# Patient Record
Sex: Female | Born: 1935 | Race: Black or African American | Hispanic: No | Smoking: Never smoker
Health system: Southern US, Community
[De-identification: ages and names within clinical notes are randomized; demographics above are authoritative.]

## PROBLEM LIST (undated history)

## (undated) DIAGNOSIS — I10 Essential (primary) hypertension: Secondary | ICD-10-CM

## (undated) DIAGNOSIS — E119 Type 2 diabetes mellitus without complications: Secondary | ICD-10-CM

## (undated) DIAGNOSIS — C3490 Malignant neoplasm of unspecified part of unspecified bronchus or lung: Secondary | ICD-10-CM

## (undated) HISTORY — PX: CHOLECYSTECTOMY: SHX55

---

## 2013-11-02 ENCOUNTER — Encounter (HOSPITAL_COMMUNITY): Payer: Self-pay | Admitting: Emergency Medicine

## 2013-11-02 ENCOUNTER — Emergency Department (HOSPITAL_COMMUNITY)
Admission: EM | Admit: 2013-11-02 | Discharge: 2013-11-02 | Disposition: A | Discharge status: Home / Self Care | Payer: Medicare Other | Attending: Emergency Medicine | Admitting: Emergency Medicine

## 2013-11-02 ENCOUNTER — Emergency Department (HOSPITAL_COMMUNITY): Payer: Medicare Other

## 2013-11-02 DIAGNOSIS — R0789 Other chest pain: Secondary | ICD-10-CM | POA: Diagnosis not present

## 2013-11-02 DIAGNOSIS — I10 Essential (primary) hypertension: Secondary | ICD-10-CM | POA: Diagnosis not present

## 2013-11-02 DIAGNOSIS — Z79899 Other long term (current) drug therapy: Secondary | ICD-10-CM | POA: Diagnosis not present

## 2013-11-02 DIAGNOSIS — E119 Type 2 diabetes mellitus without complications: Secondary | ICD-10-CM | POA: Insufficient documentation

## 2013-11-02 DIAGNOSIS — Z85118 Personal history of other malignant neoplasm of bronchus and lung: Secondary | ICD-10-CM | POA: Diagnosis not present

## 2013-11-02 DIAGNOSIS — Z7982 Long term (current) use of aspirin: Secondary | ICD-10-CM | POA: Insufficient documentation

## 2013-11-02 DIAGNOSIS — R112 Nausea with vomiting, unspecified: Secondary | ICD-10-CM

## 2013-11-02 DIAGNOSIS — R079 Chest pain, unspecified: Secondary | ICD-10-CM

## 2013-11-02 DIAGNOSIS — Z7951 Long term (current) use of inhaled steroids: Secondary | ICD-10-CM | POA: Diagnosis not present

## 2013-11-02 DIAGNOSIS — Z791 Long term (current) use of non-steroidal anti-inflammatories (NSAID): Secondary | ICD-10-CM | POA: Diagnosis not present

## 2013-11-02 DIAGNOSIS — R197 Diarrhea, unspecified: Secondary | ICD-10-CM | POA: Diagnosis not present

## 2013-11-02 HISTORY — DX: Malignant neoplasm of unspecified part of unspecified bronchus or lung: C34.90

## 2013-11-02 HISTORY — DX: Essential (primary) hypertension: I10

## 2013-11-02 HISTORY — DX: Type 2 diabetes mellitus without complications: E11.9

## 2013-11-02 LAB — CBC WITH DIFFERENTIAL/PLATELET
Basophils Absolute: 0 10*3/uL (ref 0.0–0.1)
Basophils Relative: 0 % (ref 0–1)
EOS ABS: 0 10*3/uL (ref 0.0–0.7)
Eosinophils Relative: 0 % (ref 0–5)
HEMATOCRIT: 35.4 % — AB (ref 36.0–46.0)
HEMOGLOBIN: 11.6 g/dL — AB (ref 12.0–15.0)
Lymphocytes Relative: 7 % — ABNORMAL LOW (ref 12–46)
Lymphs Abs: 0.8 10*3/uL (ref 0.7–4.0)
MCH: 27.2 pg (ref 26.0–34.0)
MCHC: 32.8 g/dL (ref 30.0–36.0)
MCV: 82.9 fL (ref 78.0–100.0)
MONO ABS: 0.5 10*3/uL (ref 0.1–1.0)
MONOS PCT: 4 % (ref 3–12)
NEUTROS PCT: 89 % — AB (ref 43–77)
Neutro Abs: 10.4 10*3/uL — ABNORMAL HIGH (ref 1.7–7.7)
Platelets: 217 10*3/uL (ref 150–400)
RBC: 4.27 MIL/uL (ref 3.87–5.11)
RDW: 15.3 % (ref 11.5–15.5)
WBC: 11.8 10*3/uL — ABNORMAL HIGH (ref 4.0–10.5)

## 2013-11-02 LAB — COMPREHENSIVE METABOLIC PANEL
ALBUMIN: 3.6 g/dL (ref 3.5–5.2)
ALT: 19 U/L (ref 0–35)
ANION GAP: 15 (ref 5–15)
AST: 22 U/L (ref 0–37)
Alkaline Phosphatase: 51 U/L (ref 39–117)
BUN: 22 mg/dL (ref 6–23)
CALCIUM: 9.8 mg/dL (ref 8.4–10.5)
CO2: 22 mEq/L (ref 19–32)
Chloride: 106 mEq/L (ref 96–112)
Creatinine, Ser: 0.83 mg/dL (ref 0.50–1.10)
GFR calc non Af Amer: 62 mL/min — ABNORMAL LOW (ref >90)
GFR, EST AFRICAN AMERICAN: 71 mL/min — AB (ref >90)
GLUCOSE: 127 mg/dL — AB (ref 70–99)
Potassium: 4.1 mEq/L (ref 3.7–5.3)
Sodium: 143 mEq/L (ref 137–147)
TOTAL PROTEIN: 7.2 g/dL (ref 6.0–8.3)
Total Bilirubin: 0.5 mg/dL (ref 0.3–1.2)

## 2013-11-02 LAB — LIPASE, BLOOD: Lipase: 52 U/L (ref 11–59)

## 2013-11-02 LAB — TROPONIN I: Troponin I: <0.3 ng/mL (ref <0.30)

## 2013-11-02 MED ORDER — ONDANSETRON HCL 4 MG PO TABS: 4.0000 mg | ORAL_TABLET | Freq: Four times a day (QID) | ORAL | Status: AC

## 2013-11-02 NOTE — Discharge Instructions (Signed)

## 2013-11-02 NOTE — ED Notes (Signed)
Patient with emesis and chest pain that woke her from sleep this am.  Patient does not have any chest pain at this time.  Patient has had 2 episodes similar to this before.  Patient is CAOx3 upon arrival to ED.  Patient took 324mg  ASA before EMS arrival.  No shortness of breath.

## 2013-11-02 NOTE — ED Notes (Signed)
Pt returned from xray, wheelchaired to bathroom

## 2013-11-02 NOTE — ED Notes (Signed)
Pt resting; family at bedside; no needs at this time

## 2013-11-02 NOTE — ED Provider Notes (Signed)
MSE was initiated and I personally evaluated the patient and placed orders (if any) at  6:29 AM on November 02, 2013.  The patient appears stable so that the remainder of the MSE may be completed by another provider.  Pt woke just prior to arrival with complaint of "feeling sick" with nausea, vomiting, brief chest pain, and diarrhea.  No sick contacts, no unusual foods,  No fever.  No shortness of breath.  Pt denies any current chest pain or abdominal pain.  Pt with h/o DM, HTN, denies prior cardiac history.  Pt s/p cholecystectomy.  Pt has taken aspirin-324 mg.    Labs, ekg and chest xray ordered.  IV from EMS patent.  Pt is stable.      EKG Interpretation  Date/Time:  Saturday November 02 2013 06:23:07 EDT Ventricular Rate:  54 PR Interval:  311 QRS Duration: 95 QT Interval:  475 QTC Calculation: 450 R Axis:   36 Text Interpretation:  Sinus or ectopic atrial rhythm Prolonged PR interval Low voltage, precordial leads Borderline T abnormalities, inferior leads No old tracing to compare Confirmed by Malique Driskill  MD, Scarlette Hogston ([id number]) on 11/02/2013 6:31:32 AM          Kalman Drape, MD 11/02/13 [phone]

## 2013-11-02 NOTE — ED Provider Notes (Signed)
CSN: [id number]     Arrival date & time 11/02/13  0610 History   First MD Initiated Contact with Patient 11/02/13 [phone]     Chief Complaint  Patient presents with  . Chest Pain  . Emesis     (Consider location/radiation/quality/duration/timing/severity/associated sxs/prior Treatment) HPI Tammie Rios is an 80 year old female with past medical history of hypertension, diabetes, lung cancer who presents to the ER today with episode of nausea, vomiting, diarrhea, chest pain. Patient states she woke up nauseated approximately 3:30 AM this morning, and went to the bathroom and began vomiting and having diarrhea. Patient states was using the toilet, she noticed an acute onset of a tightness in her substernal region. Patient states the discomfort lasted for approximately 5-6 minutes, and has not returned since. Patient states she also noted mild lightheadedness, and some shortness of breath at that time. He states the discomfort radiated to her back. Patient denies loss of consciousness, palpitations, abdominal pain, dysuria, fever. Past Medical History  Diagnosis Date  . Diabetes mellitus without complication   . Hypertension   . Lung cancer    Past Surgical History  Procedure Laterality Date  . Cholecystectomy     No family history on file. History  Substance Use Topics  . Smoking status: Never Smoker   . Smokeless tobacco: Not on file  . Alcohol Use: No   OB History   Grav Para Term Preterm Abortions TAB SAB Ect Mult Living                 Review of Systems  Constitutional: Negative for fever.  HENT: Negative for trouble swallowing.   Eyes: Negative for visual disturbance.  Respiratory: Positive for chest tightness. Negative for shortness of breath.   Cardiovascular: Positive for chest pain.  Gastrointestinal: Positive for nausea, vomiting and diarrhea. Negative for abdominal pain.  Genitourinary: Negative for dysuria.  Musculoskeletal: Negative for neck pain.  Skin: Negative  for rash.  Neurological: Negative for dizziness, weakness and numbness.  Psychiatric/Behavioral: Negative.       Allergies  Lisinopril  Home Medications   Prior to Admission medications   Medication Sig Start Date End Date Taking? Authorizing Provider  albuterol (PROVENTIL HFA;VENTOLIN HFA) 108 (90 BASE) MCG/ACT inhaler Inhale 2 puffs into the lungs every 6 (six) hours as needed for wheezing or shortness of breath.   Yes Historical Provider, MD  aspirin 81 MG chewable tablet Chew 324 mg by mouth once.   Yes Historical Provider, MD  aspirin 81 MG chewable tablet Chew 81 mg by mouth daily.   Yes Historical Provider, MD  Cholecalciferol (VITAMIN D) 2000 UNITS tablet Take 2,000 Units by mouth daily.   Yes Historical Provider, MD  ezetimibe-simvastatin (VYTORIN) 10-40 MG per tablet Take 1 tablet by mouth daily.   Yes Historical Provider, MD  latanoprost (XALATAN) 0.005 % ophthalmic solution Place 1 drop into both eyes at bedtime.   Yes Historical Provider, MD  meloxicam (MOBIC) 15 MG tablet Take 15 mg by mouth daily.   Yes Historical Provider, MD  metFORMIN (GLUCOPHAGE) 500 MG tablet Take 500 mg by mouth 2 (two) times daily with a meal.   Yes Historical Provider, MD  mometasone-formoterol (DULERA) 100-5 MCG/ACT AERO Inhale 2 puffs into the lungs 2 (two) times daily.   Yes Historical Provider, MD  omega-3 acid ethyl esters (LOVAZA) 1 G capsule Take 1 g by mouth daily.   Yes Historical Provider, MD  sitaGLIPtin (JANUVIA) 50 MG tablet Take 50 mg by mouth daily.  Yes Historical Provider, MD  valsartan-hydrochlorothiazide (DIOVAN-HCT) 160-12.5 MG per tablet Take 1 tablet by mouth daily.   Yes Historical Provider, MD  vitamin C (ASCORBIC ACID) 500 MG tablet Take 500 mg by mouth daily.   Yes Historical Provider, MD  ondansetron (ZOFRAN) 4 MG tablet Take 1 tablet (4 mg total) by mouth every 6 (six) hours. 11/02/13   Carrie Mew, PA-C   BP 153/57  Pulse 59  Temp(Src) 97.3 F (36.3 C) (Oral)   Resp 24  Ht 5\' 3"  (1.6 m)  Wt 183 lb (83.008 kg)  BMI 32.43 kg/m2  SpO2 96% Physical Exam  Constitutional: She is oriented to person, place, and time. She appears well-developed and well-nourished. No distress.  HENT:  Head: Normocephalic and atraumatic.  Mouth/Throat: Oropharynx is clear and moist. No oropharyngeal exudate.  Eyes: Right eye exhibits no discharge. Left eye exhibits no discharge. No scleral icterus.  Neck: Normal range of motion.  Cardiovascular: Normal rate, regular rhythm and normal heart sounds.   No murmur heard. Pulmonary/Chest: Effort normal. No accessory muscle usage. Not tachypneic. No respiratory distress. She has rales in the right lower field and the left lower field.  Mild bibasilar inspiratory rales noted.  Abdominal: Soft. There is no tenderness.  Musculoskeletal: Normal range of motion. She exhibits no edema and no tenderness.  Neurological: She is alert and oriented to person, place, and time. No cranial nerve deficit. Coordination normal.  Skin: Skin is warm and dry. No rash noted. She is not diaphoretic.  Psychiatric: She has a normal mood and affect.    ED Course  Procedures (including critical care time) Labs Review Labs Reviewed  CBC WITH DIFFERENTIAL - Abnormal; Notable for the following:    WBC 11.8 (*)    Hemoglobin 11.6 (*)    HCT 35.4 (*)    Neutrophils Relative % 89 (*)    Neutro Abs 10.4 (*)    Lymphocytes Relative 7 (*)    All other components within normal limits  COMPREHENSIVE METABOLIC PANEL - Abnormal; Notable for the following:    Glucose, Bld 127 (*)    GFR calc non Af Amer 62 (*)    GFR calc Af Amer 71 (*)    All other components within normal limits  LIPASE, BLOOD  TROPONIN I  TROPONIN I    Imaging Review Dg Chest 2 View  11/02/2013   CLINICAL DATA:  Chest pain and emesis waking patient from sleep  EXAM: CHEST  2 VIEW  COMPARISON:  March 17, 2008  FINDINGS: There is no edema or consolidation. Heart is upper normal  in size with pulmonary vascularity within normal limits. No adenopathy. No pneumothorax. There is degenerative change in the thoracic spine. There is atherosclerotic change in the aorta.  IMPRESSION: No edema or consolidation.  Atherosclerotic change noted.   Electronically Signed   By: Lowella Grip M.D.   On: 11/02/2013 08:07     EKG Interpretation   Date/Time:  Saturday November 02 2013 06:23:07 EDT Ventricular Rate:  54 PR Interval:  311 QRS Duration: 95 QT Interval:  475 QTC Calculation: 450 R Axis:   36 Text Interpretation:  Sinus or ectopic atrial rhythm Prolonged PR interval  Low voltage, precordial leads Borderline T abnormalities, inferior leads  No old tracing to compare Confirmed by OTTER  MD, OLGA ([id number]) on  11/02/2013 6:31:32 AM      MDM   Final diagnoses:  Nausea and vomiting, vomiting of unspecified type  Chest pain, unspecified chest  pain type    80 year old female nonsmoker diabetic with hypertension with family history of a sister with cardiac history presenting with episode of nausea, vomiting, diarrhea with a 5-6 minute episode of chest tightness. Patient stating her symptoms persisted until she called EMS. In the ER patient is asymptomatic. Workup for rule out of ACS.  Patient with no remarkable leukocytosis, anemia, electrolyte imbalance. Chest radiograph unremarkable for any edema or consolidation.  12:00 PM: Patient's second troponin negative, patient remaining asymptomatic, we will discharge patient at this time and have her followup with primary care physician. I discussed return precautions with patient, and encourage her to return to ER should her symptoms change, persist, return, worsen or should she have any questions or concerns.Patient is to be discharged with recommendation to follow up with PCP in regards to today's hospital visit. Chest pain is not likely of cardiac or pulmonary etiology d/t presentation, Wells PE criteria negative, VSS, no  tracheal deviation, no JVD or new murmur, RRR, breath sounds equal bilaterally, EKG without acute abnormalities, negative troponin, and negative CXR. Pt has been advised  return to the ED is CP becomes exertional, associated with diaphoresis or nausea, radiates to left jaw/arm, worsens or becomes concerning in any way. Pt appears reliable for follow up and is agreeable to discharge.   BP 153/57  Pulse 59  Temp(Src) 97.3 F (36.3 C) (Oral)  Resp 24  Ht 5\' 3"  (1.6 m)  Wt 183 lb (83.008 kg)  BMI 32.43 kg/m2  SpO2 96%  Signed,  Dahlia Bailiff, PA-C 6:00 PM  This patient seen and discussed with Dr. Davonna Belling, M.D.  Carrie Mew, PA-C 11/02/13 1800

## 2013-11-02 NOTE — ED Notes (Signed)
Pt resting; talking to visitor in room; no needs at this time

## 2013-11-03 NOTE — ED Provider Notes (Signed)
Medical screening examination/treatment/procedure(s) were conducted as a shared visit with non-physician practitioner(s) and myself.  I personally evaluated the patient during the encounter.   EKG Interpretation   Date/Time:  Saturday November 02 2013 06:23:07 EDT Ventricular Rate:  54 PR Interval:  311 QRS Duration: 95 QT Interval:  475 QTC Calculation: 450 R Axis:   36 Text Interpretation:  Sinus or ectopic atrial rhythm Prolonged PR interval  Low voltage, precordial leads Borderline T abnormalities, inferior leads  No old tracing to compare Confirmed by OTTER  MD, OLGA ([id number]) on  11/02/2013 6:31:32 AM     Patient with chest pain. EKG reassuring. Has had history of same. Began with nausea vomiting diarrhea. Patient does have some risk factors, however I doubt this was ischemic in cause. Enzymes are negative. Will discharge home followup as needed  Jasper Riling. Alvino Chapel, MD 11/03/13 0730

## 2015-09-25 IMAGING — CR DG CHEST 2V
2 series · 2 of 2 positions shown · non-contrast
Comparison: March 17, 2008

CLINICAL DATA: Chest pain and emesis waking patient from sleep

EXAM:
CHEST  2 VIEW

[w chest pa]
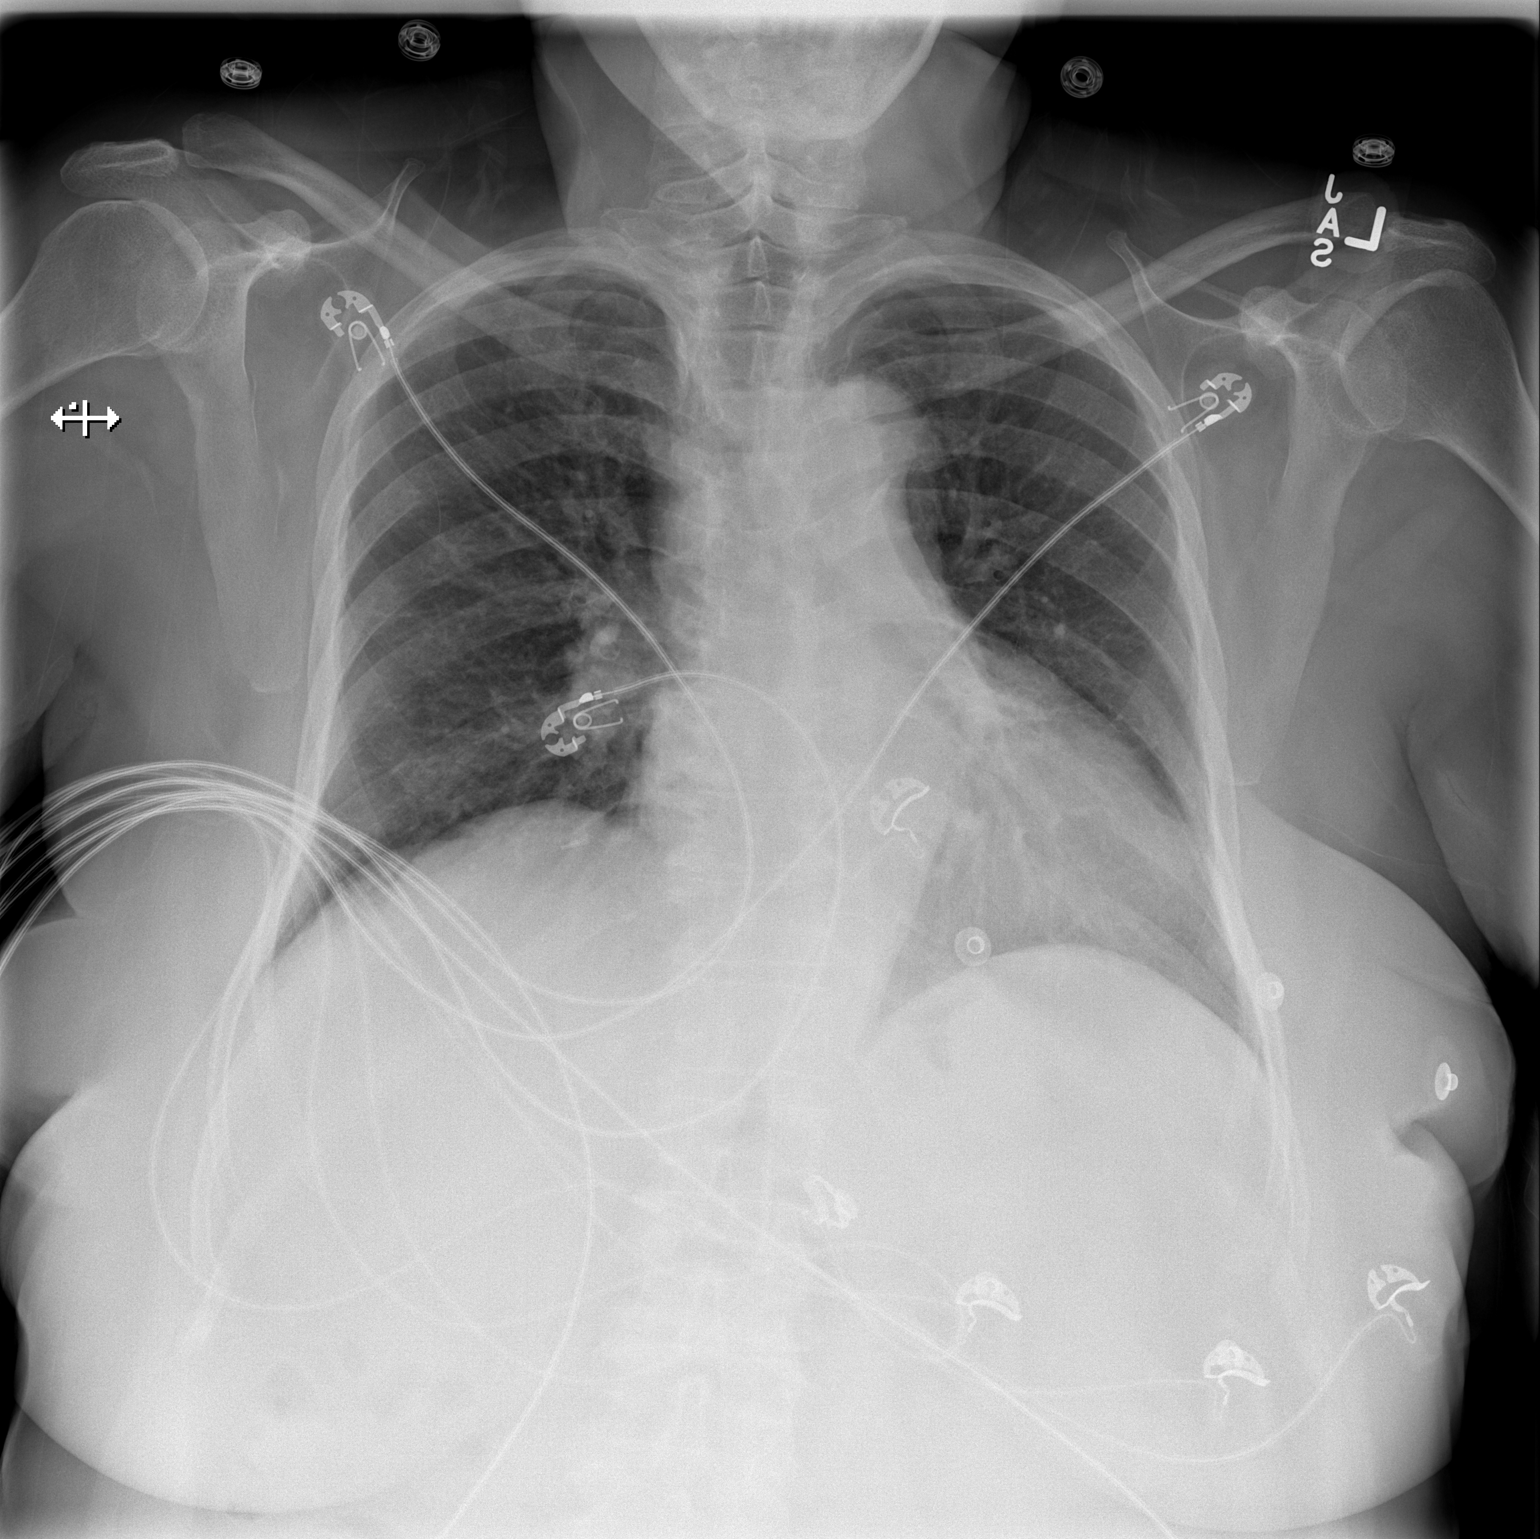

[w chest lat]
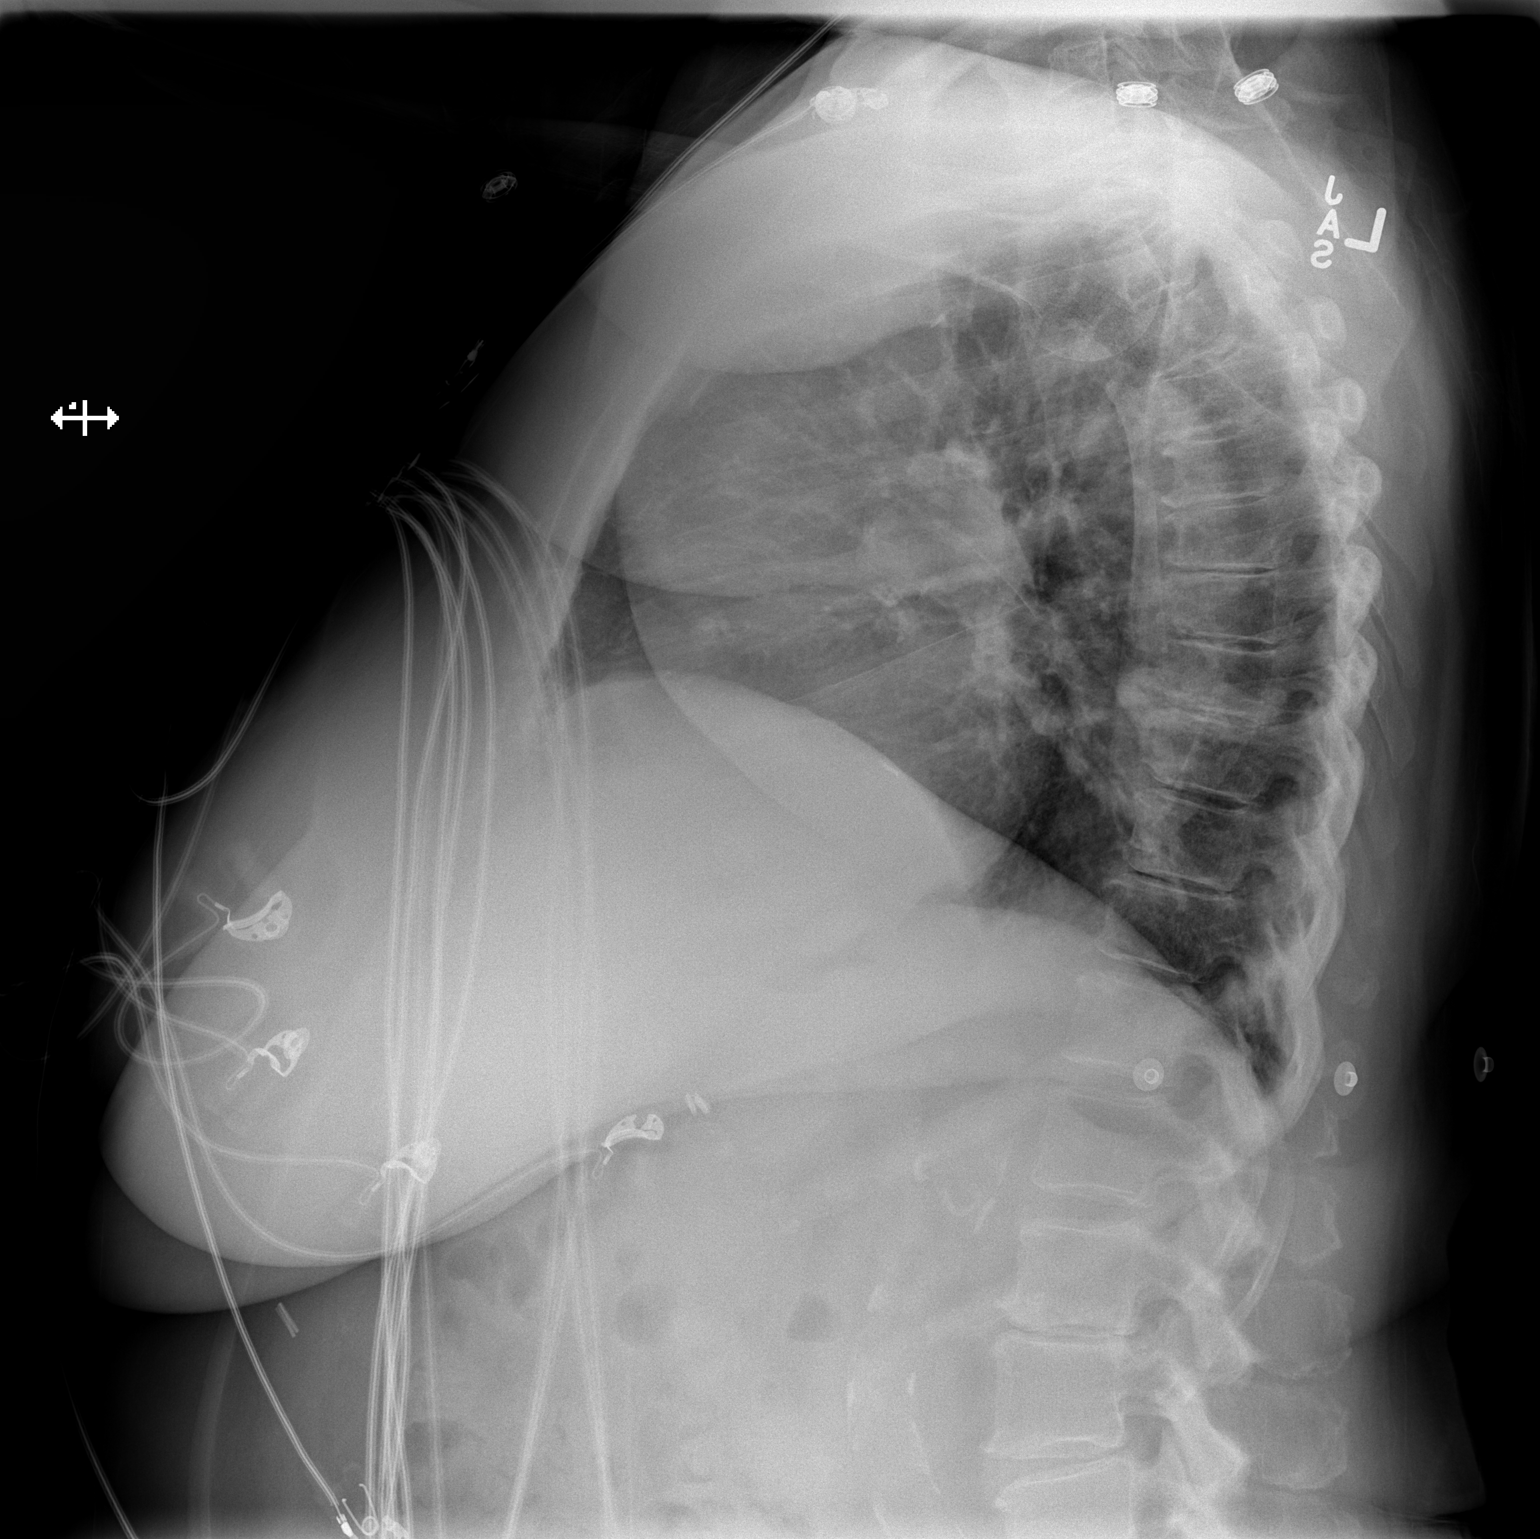

[2 of 2 positions shown; findings below may reference images not displayed]

FINDINGS: There is no edema or consolidation. Heart is upper normal in size
with pulmonary vascularity within normal limits. No adenopathy. No
pneumothorax. There is degenerative change in the thoracic spine.
There is atherosclerotic change in the aorta.
IMPRESSION: No edema or consolidation.  Atherosclerotic change noted.

## 2018-04-11 ENCOUNTER — Encounter (HOSPITAL_COMMUNITY): Payer: Self-pay | Admitting: Emergency Medicine

## 2018-04-11 ENCOUNTER — Emergency Department (HOSPITAL_COMMUNITY): Payer: Medicare Other

## 2018-04-11 ENCOUNTER — Other Ambulatory Visit: Payer: Self-pay

## 2018-04-11 DIAGNOSIS — Z539 Procedure and treatment not carried out, unspecified reason: Secondary | ICD-10-CM | POA: Diagnosis present

## 2018-04-11 DIAGNOSIS — Z923 Personal history of irradiation: Secondary | ICD-10-CM

## 2018-04-11 DIAGNOSIS — D509 Iron deficiency anemia, unspecified: Secondary | ICD-10-CM | POA: Diagnosis present

## 2018-04-11 DIAGNOSIS — N179 Acute kidney failure, unspecified: Secondary | ICD-10-CM | POA: Diagnosis present

## 2018-04-11 DIAGNOSIS — I25110 Atherosclerotic heart disease of native coronary artery with unstable angina pectoris: Secondary | ICD-10-CM | POA: Diagnosis present

## 2018-04-11 DIAGNOSIS — Z85118 Personal history of other malignant neoplasm of bronchus and lung: Secondary | ICD-10-CM

## 2018-04-11 DIAGNOSIS — Z79899 Other long term (current) drug therapy: Secondary | ICD-10-CM

## 2018-04-11 DIAGNOSIS — R079 Chest pain, unspecified: Secondary | ICD-10-CM | POA: Diagnosis present

## 2018-04-11 DIAGNOSIS — Z7984 Long term (current) use of oral hypoglycemic drugs: Secondary | ICD-10-CM

## 2018-04-11 DIAGNOSIS — R7989 Other specified abnormal findings of blood chemistry: Secondary | ICD-10-CM | POA: Diagnosis present

## 2018-04-11 DIAGNOSIS — Z66 Do not resuscitate: Secondary | ICD-10-CM | POA: Diagnosis present

## 2018-04-11 DIAGNOSIS — E871 Hypo-osmolality and hyponatremia: Secondary | ICD-10-CM | POA: Diagnosis present

## 2018-04-11 DIAGNOSIS — Z87891 Personal history of nicotine dependence: Secondary | ICD-10-CM

## 2018-04-11 DIAGNOSIS — I214 Non-ST elevation (NSTEMI) myocardial infarction: Secondary | ICD-10-CM | POA: Diagnosis not present

## 2018-04-11 DIAGNOSIS — Z9049 Acquired absence of other specified parts of digestive tract: Secondary | ICD-10-CM

## 2018-04-11 DIAGNOSIS — F419 Anxiety disorder, unspecified: Secondary | ICD-10-CM | POA: Diagnosis present

## 2018-04-11 DIAGNOSIS — Z7189 Other specified counseling: Secondary | ICD-10-CM

## 2018-04-11 DIAGNOSIS — R778 Other specified abnormalities of plasma proteins: Secondary | ICD-10-CM | POA: Diagnosis present

## 2018-04-11 DIAGNOSIS — I10 Essential (primary) hypertension: Secondary | ICD-10-CM | POA: Diagnosis present

## 2018-04-11 DIAGNOSIS — Z7982 Long term (current) use of aspirin: Secondary | ICD-10-CM

## 2018-04-11 DIAGNOSIS — E119 Type 2 diabetes mellitus without complications: Secondary | ICD-10-CM

## 2018-04-11 DIAGNOSIS — J45909 Unspecified asthma, uncomplicated: Secondary | ICD-10-CM | POA: Diagnosis present

## 2018-04-11 DIAGNOSIS — J9601 Acute respiratory failure with hypoxia: Secondary | ICD-10-CM

## 2018-04-11 DIAGNOSIS — R001 Bradycardia, unspecified: Secondary | ICD-10-CM | POA: Diagnosis present

## 2018-04-11 DIAGNOSIS — I440 Atrioventricular block, first degree: Secondary | ICD-10-CM | POA: Diagnosis present

## 2018-04-11 DIAGNOSIS — E872 Acidosis: Secondary | ICD-10-CM | POA: Diagnosis present

## 2018-04-11 DIAGNOSIS — I5031 Acute diastolic (congestive) heart failure: Secondary | ICD-10-CM | POA: Diagnosis not present

## 2018-04-11 DIAGNOSIS — Z515 Encounter for palliative care: Secondary | ICD-10-CM

## 2018-04-11 DIAGNOSIS — I110 Hypertensive heart disease with heart failure: Secondary | ICD-10-CM | POA: Diagnosis present

## 2018-04-11 DIAGNOSIS — I959 Hypotension, unspecified: Secondary | ICD-10-CM | POA: Diagnosis present

## 2018-04-11 LAB — CBC WITH DIFFERENTIAL/PLATELET
ABS IMMATURE GRANULOCYTES: 0.04 10*3/uL (ref 0.00–0.07)
BASOS ABS: 0 10*3/uL (ref 0.0–0.1)
BASOS PCT: 0 %
Eosinophils Absolute: 0 10*3/uL (ref 0.0–0.5)
Eosinophils Relative: 0 %
HCT: 36.1 % (ref 36.0–46.0)
Hemoglobin: 11.4 g/dL — ABNORMAL LOW (ref 12.0–15.0)
IMMATURE GRANULOCYTES: 1 %
Lymphocytes Relative: 14 %
Lymphs Abs: 0.8 10*3/uL (ref 0.7–4.0)
MCH: 26.8 pg (ref 26.0–34.0)
MCHC: 31.6 g/dL (ref 30.0–36.0)
MCV: 84.7 fL (ref 80.0–100.0)
Monocytes Absolute: 0.4 10*3/uL (ref 0.1–1.0)
Monocytes Relative: 8 %
Neutro Abs: 4 10*3/uL (ref 1.7–7.7)
Neutrophils Relative %: 77 %
PLATELETS: 178 10*3/uL (ref 150–400)
RBC: 4.26 MIL/uL (ref 3.87–5.11)
RDW: 14.3 % (ref 11.5–15.5)
WBC: 5.2 10*3/uL (ref 4.0–10.5)
nRBC: 0 % (ref 0.0–0.2)

## 2018-04-11 LAB — BASIC METABOLIC PANEL
ANION GAP: 11 (ref 5–15)
BUN: 19 mg/dL (ref 8–23)
CO2: 17 mmol/L — ABNORMAL LOW (ref 22–32)
Calcium: 9.2 mg/dL (ref 8.9–10.3)
Chloride: 105 mmol/L (ref 98–111)
Creatinine, Ser: 1.19 mg/dL — ABNORMAL HIGH (ref 0.44–1.00)
GFR calc Af Amer: 46 mL/min — ABNORMAL LOW (ref >60)
GFR, EST NON AFRICAN AMERICAN: 40 mL/min — AB (ref >60)
Glucose, Bld: 143 mg/dL — ABNORMAL HIGH (ref 70–99)
POTASSIUM: 4.8 mmol/L (ref 3.5–5.1)
SODIUM: 133 mmol/L — AB (ref 135–145)

## 2018-04-11 LAB — TROPONIN I
Troponin I: 0.17 ng/mL (ref <0.03)
Troponin I: 0.34 ng/mL (ref <0.03)

## 2018-04-11 LAB — I-STAT TROPONIN, ED: TROPONIN I, POC: 0.2 ng/mL — AB (ref 0.00–0.08)

## 2018-04-11 LAB — GLUCOSE, CAPILLARY: Glucose-Capillary: 170 mg/dL — ABNORMAL HIGH (ref 70–99)

## 2018-04-11 MED ORDER — LATANOPROST 0.005 % OP SOLN
1.0000 [drp] | Freq: Every day | OPHTHALMIC | Status: DC
  Administered 2018-04-11 – 2018-04-15 (×5): 1 [drp] via OPHTHALMIC
  Filled 2018-04-11 (×2): qty 2.5

## 2018-04-11 MED ORDER — ONDANSETRON HCL 4 MG/2ML IJ SOLN: 4.0000 mg | Freq: Four times a day (QID) | INTRAMUSCULAR | Status: DC | PRN

## 2018-04-11 MED ORDER — MOMETASONE FURO-FORMOTEROL FUM 100-5 MCG/ACT IN AERO
2.0000 | INHALATION_SPRAY | Freq: Two times a day (BID) | RESPIRATORY_TRACT | Status: DC
  Administered 2018-04-11 – 2018-04-15 (×7): 2 via RESPIRATORY_TRACT
  Filled 2018-04-11 (×2): qty 8.8

## 2018-04-11 MED ORDER — VITAMIN D 25 MCG (1000 UNIT) PO TABS
2000.0000 [IU] | ORAL_TABLET | Freq: Every day | ORAL | Status: DC
  Administered 2018-04-12 – 2018-04-14 (×3): 2000 [IU] via ORAL
  Filled 2018-04-11 (×4): qty 2

## 2018-04-11 MED ORDER — VALSARTAN 160 MG PO TABS
160.0000 mg | ORAL_TABLET | Freq: Every day | ORAL | Status: DC
  Administered 2018-04-11 – 2018-04-14 (×4): 160 mg via ORAL
  Filled 2018-04-11 (×6): qty 1

## 2018-04-11 MED ORDER — ACETAMINOPHEN 325 MG PO TABS: 650.0000 mg | ORAL_TABLET | ORAL | Status: DC | PRN

## 2018-04-11 MED ORDER — HEPARIN BOLUS VIA INFUSION
4000.0000 [IU] | Freq: Once | INTRAVENOUS | Status: AC
  Administered 2018-04-11: 4000 [IU] via INTRAVENOUS
  Filled 2018-04-11: qty 4000

## 2018-04-11 MED ORDER — HEPARIN (PORCINE) 25000 UT/250ML-% IV SOLN
800.0000 [IU]/h | INTRAVENOUS | Status: DC
  Administered 2018-04-11: 800 [IU]/h via INTRAVENOUS
  Filled 2018-04-11: qty 250

## 2018-04-11 MED ORDER — VALSARTAN-HYDROCHLOROTHIAZIDE 160-12.5 MG PO TABS: 1.0000 | ORAL_TABLET | Freq: Every day | ORAL | Status: DC

## 2018-04-11 MED ORDER — EZETIMIBE-SIMVASTATIN 10-40 MG PO TABS
1.0000 | ORAL_TABLET | Freq: Every day | ORAL | Status: DC
  Administered 2018-04-11: 1 via ORAL
  Filled 2018-04-11 (×3): qty 1

## 2018-04-11 MED ORDER — VITAMIN C 500 MG PO TABS
500.0000 mg | ORAL_TABLET | Freq: Every day | ORAL | Status: DC
  Administered 2018-04-12 – 2018-04-14 (×3): 500 mg via ORAL
  Filled 2018-04-11 (×4): qty 1

## 2018-04-11 MED ORDER — INSULIN ASPART 100 UNIT/ML ~~LOC~~ SOLN
0.0000 [IU] | Freq: Three times a day (TID) | SUBCUTANEOUS | Status: DC
  Administered 2018-04-12 – 2018-04-14 (×5): 2 [IU] via SUBCUTANEOUS
  Administered 2018-04-15: 3 [IU] via SUBCUTANEOUS
  Administered 2018-04-15 – 2018-04-16 (×2): 2 [IU] via SUBCUTANEOUS

## 2018-04-11 MED ORDER — HYDROCHLOROTHIAZIDE 12.5 MG PO CAPS
12.5000 mg | ORAL_CAPSULE | Freq: Every day | ORAL | Status: DC
  Administered 2018-04-11 – 2018-04-12 (×2): 12.5 mg via ORAL
  Filled 2018-04-11 (×2): qty 1

## 2018-04-11 MED ORDER — ALBUTEROL SULFATE (2.5 MG/3ML) 0.083% IN NEBU: 2.5000 mg | INHALATION_SOLUTION | Freq: Four times a day (QID) | RESPIRATORY_TRACT | Status: DC | PRN

## 2018-04-11 MED ORDER — PANTOPRAZOLE SODIUM 40 MG PO TBEC
40.0000 mg | DELAYED_RELEASE_TABLET | Freq: Every day | ORAL | Status: DC
  Administered 2018-04-12 – 2018-04-16 (×5): 40 mg via ORAL
  Filled 2018-04-11 (×5): qty 1

## 2018-04-11 MED ORDER — OMEGA-3-ACID ETHYL ESTERS 1 G PO CAPS
1.0000 g | ORAL_CAPSULE | Freq: Every day | ORAL | Status: DC
  Administered 2018-04-12 – 2018-04-13 (×2): 1 g via ORAL
  Filled 2018-04-11 (×4): qty 1

## 2018-04-11 MED ORDER — ASPIRIN 81 MG PO CHEW
81.0000 mg | CHEWABLE_TABLET | Freq: Every day | ORAL | Status: DC
  Administered 2018-04-12: 81 mg via ORAL
  Filled 2018-04-11: qty 1

## 2018-04-11 NOTE — ED Notes (Signed)
Verified heparin with [facility] RN

## 2018-04-11 NOTE — Progress Notes (Signed)
ANTICOAGULATION CONSULT NOTE - Initial Consult  Pharmacy Consult for heparin Indication: chest pain/ACS  Allergies  Allergen Reactions  . Lisinopril Cough    Patient Measurements: Height: 5\' 3"  (160 cm) Weight: 162 lb (73.5 kg) IBW/kg (Calculated) : 52.4 Heparin Dosing Weight: 68kg  Vital Signs: Temp: 98.5 F (36.9 C) (03/18 1323) Temp Source: Oral (03/18 1323) BP: 136/66 (03/18 1430) Pulse Rate: 84 (03/18 1430)  Labs: Recent Labs    03/30/2018 1418  CREATININE 1.19*    Estimated Creatinine Clearance: 29.6 mL/min (A) (by C-G formula based on SCr of 1.19 mg/dL (H)).   Medical History: Past Medical History:  Diagnosis Date  . Diabetes mellitus without complication ([place])   . Hypertension   . Lung cancer ([place])    Assessment: 10 YOF presenting with CP, initial troponin elevated, no anticoagulation PTA.  Hgb 11.4, plts wnl.   Goal of Therapy:  Heparin level 0.3-0.7 units/ml Monitor platelets by anticoagulation protocol: Yes   Plan:  Heparin 4000 units IV x 1, and gtt at 800 units/hr F/u 8 hour heparin level  Bertis Ruddy, PharmD Clinical Pharmacist Please check AMION for all [place] numbers 04/21/2018 3:06 PM

## 2018-04-11 NOTE — ED Notes (Signed)
ED TO INPATIENT HANDOFF REPORT  ED Nurse Name and Phone #: Gerron Guidotti [phone]  S Name/Age/Gender Tammie Rios 85 y.o. female Room/Bed: 046C/046C  Code Status   Code Status: Not on file  Home/SNF/Other Home Patient oriented to: self, place, time and situation Is this baseline? Yes   Triage Complete: Triage complete  Chief Complaint cp  Triage Note [facility]- pt from home. Pt had a cough for 2-3 weeks. Today she woke up with chest pain. Pt given 324 ASA and 2 nitro. No change in chest pain. Pt takes BP meds for high BP but has not had an appetite recently so hasnt been taking them.   BP 107/56 CBG 260 98% RA HR 70   Allergies Allergies  Allergen Reactions  . Lisinopril Cough    Level of Care/Admitting Diagnosis ED Disposition    ED Disposition Condition [facility]: [place] [[id number]]  Level of Care: Telemetry Cardiac [103]  Diagnosis: Chest pain [098119]  Admitting Physician: Oda Kilts [[id number]]  Attending Physician: Oda Kilts [[id number]]  PT Class (Do Not Modify): Observation [104]  PT Acc Code (Do Not Modify): Observation [10022]       B Medical/Surgery History Past Medical History:  Diagnosis Date  . Diabetes mellitus without complication ([place])   . Hypertension   . Lung cancer [facility])    Past Surgical History:  Procedure Laterality Date  . CHOLECYSTECTOMY       A IV Location/Drains/Wounds Patient Lines/Drains/Airways Status   Active Line/Drains/Airways    Name:   Placement date:   Placement time:   Site:   Days:   Peripheral IV 03/28/2018 Left Wrist   04/14/2018    1438    Wrist   less than 1          Intake/Output Last 24 hours No intake or output data in the 24 hours ending 04/09/2018 1552  Labs/Imaging Results for orders placed or performed during the hospital encounter of 04/15/2018 (from the past 48 hour(s))  Basic metabolic panel     Status: Abnormal   Collection Time: 04/03/2018  2:18 PM   Result Value Ref Range   Sodium 133 (L) 135 - 145 mmol/L   Potassium 4.8 3.5 - 5.1 mmol/L    Comment: SLIGHT HEMOLYSIS   Chloride 105 98 - 111 mmol/L   CO2 17 (L) 22 - 32 mmol/L   Glucose, Bld 143 (H) 70 - 99 mg/dL   BUN 19 8 - 23 mg/dL   Creatinine, Ser 1.19 (H) 0.44 - 1.00 mg/dL   Calcium 9.2 8.9 - 10.3 mg/dL   GFR calc non Af Amer 40 (L) >60 mL/min   GFR calc Af Amer 46 (L) >60 mL/min   Anion gap 11 5 - 15    Comment: Performed at [facility], [address] [place], [place] [address]  I-Stat Troponin, ED (not at [facility])     Status: Abnormal   Collection Time: 03/31/2018  2:23 PM  Result Value Ref Range   Troponin i, poc 0.20 (HH) 0.00 - 0.08 ng/mL   Comment NOTIFIED PHYSICIAN    Comment 3            Comment: Due to the release kinetics of cTnI, a negative result within the first hours of the onset of symptoms does not rule out myocardial infarction with certainty. If myocardial infarction is still suspected, repeat the test at appropriate intervals.   CBC with Differential  Status: Abnormal   Collection Time: 04/14/2018  2:52 PM  Result Value Ref Range   WBC 5.2 4.0 - 10.5 K/uL   RBC 4.26 3.87 - 5.11 MIL/uL   Hemoglobin 11.4 (L) 12.0 - 15.0 g/dL   HCT 36.1 36.0 - 46.0 %   MCV 84.7 80.0 - 100.0 fL   MCH 26.8 26.0 - 34.0 pg   MCHC 31.6 30.0 - 36.0 g/dL   RDW 14.3 11.5 - 15.5 %   Platelets 178 150 - 400 K/uL   nRBC 0.0 0.0 - 0.2 %   Neutrophils Relative % 77 %   Neutro Abs 4.0 1.7 - 7.7 K/uL   Lymphocytes Relative 14 %   Lymphs Abs 0.8 0.7 - 4.0 K/uL   Monocytes Relative 8 %   Monocytes Absolute 0.4 0.1 - 1.0 K/uL   Eosinophils Relative 0 %   Eosinophils Absolute 0.0 0.0 - 0.5 K/uL   Basophils Relative 0 %   Basophils Absolute 0.0 0.0 - 0.1 K/uL   Immature Granulocytes 1 %   Abs Immature Granulocytes 0.04 0.00 - 0.07 K/uL    Comment: Performed at [facility] [address], [place], [place] [address]   Dg Chest Portable 1 View  Result Date:  04/22/2018 CLINICAL DATA:  85 year old female with nonproductive cough for 6 days. Central chest pain. EXAM: PORTABLE CHEST 1 VIEW COMPARISON:  Chest radiographs 05/03/2017 and earlier. FINDINGS: Portable AP semi upright view at 1357 hours. Lung volumes and mediastinal contours remain normal. Visualized tracheal air column is within normal limits. Allowing for portable technique the lungs are clear. Paucity of bowel gas in the upper abdomen. No acute osseous abnormality identified. Stable cholecystectomy clips.  Calcified aortic atherosclerosis. IMPRESSION: No acute cardiopulmonary abnormality. Electronically Signed   By: Genevie Ann M.D.   On: 04/06/2018 14:18    Pending Labs Unresulted Labs (From admission, onward)    Start     Ordered   03/25/2018 0500  Heparin level (unfractionated)  Daily,   R     03/26/2018 1520   04/07/2018 0500  CBC  Daily,   R     04/21/2018 1520   03/30/2018 2300  Heparin level (unfractionated)  Once-Timed,   R     04/22/2018 1520   04/21/2018 1457  Troponin I - Once  Once,   STAT     04/14/2018 1456   04/21/2018 1359  CBC with Differential  ONCE - STAT,   STAT     03/30/2018 1358   Signed and Held  Troponin I - Now Then Q6H  Now then every 6 hours,   TIMED     Signed and Held          Vitals/Pain Today's Vitals   04/15/2018 1330 04/07/2018 1346 04/22/2018 1430 04/14/2018 1445  BP: 122/62 138/67 136/66 (!) 150/57  Pulse: 65 (!) 111 84 (!) 53  Resp: 17 12 18  (!) 21  Temp:      TempSrc:      SpO2: 100% 99% 99% 100%  Weight:      Height:      PainSc:        Isolation Precautions No active isolations  Medications Medications  heparin bolus via infusion 4,000 Units (has no administration in time range)  heparin ADULT infusion 100 units/mL (25000 units/214mL sodium chloride 0.45%) (has no administration in time range)    Mobility walks Low fall risk   Focused Assessments Cardiac Assessment Handoff:    Lab Results  Component  Value Date   TROPONINI <0.30 11/02/2013   No  results found for: DDIMER Does the Patient currently have chest pain? No     R Recommendations: See Admitting Provider Note  Report given to:   Additional Notes:

## 2018-04-11 NOTE — ED Triage Notes (Signed)
[facility]- pt from home. Pt had a cough for 2-3 weeks. Today she woke up with chest pain. Pt given 324 ASA and 2 nitro. No change in chest pain. Pt takes BP meds for high BP but has not had an appetite recently so hasnt been taking them.   BP 107/56 CBG 260 98% RA HR 70

## 2018-04-11 NOTE — H&P (Signed)
Date: 04/09/2018               Patient Name:  Tammie Rios MRN: [id number]  DOB: 12-19-1924 Age / Sex: 85 y.o., female   PCP: Rodena Medin, MD (Inactive)         Medical Service: Internal Medicine Teaching Service         Attending Physician: Dr. Rebeca Alert Raynaldo Opitz, MD    First Contact: Dr. Sherry Ruffing Pager: [phone]  Second Contact: Dr. Philipp Ovens Pager: [phone]       After Hours (After 5p/  First Contact Pager: [phone]  weekends / holidays): Second Contact Pager: [phone]   Chief Complaint: Chest pain  History of Present Illness: This is a 85 year old female with a history of lung cancer s/p treatment, HTN, DM who presented after an episode of chest pain that started this morning. Her daughter was present and provided some of the history. She was trying to eat her breakfast however was feeling nauseous and was unable to eat anything when she started having the pain, it is substernal, sharp in nature, no radiation, she is unsure of how long it lasted but states that she contacted the ambulance and that when she got the ASA and nitro that the pain subsided. She denies any sweating, fevers, chills, vomiting, or numbness or arm tingling. For the past few days she has been having some nausea, headaches, decreased appetites, dry cough, and decreased energy. She has been able to do her normal activities with no issues. She denied any recent sick contacts or travel history.   She did have an episode of chest pain in 2015, associated with nausea, sweating and diarrhea. She had normal enzymes at that time. She reports that she has had a stress test at [facility] that was normal, she has never had an echocardiogram.   In the ED she was noted to be afebrile, normal HR and RR, she did become hypertensive. Labs were significant of Na 133, Bicarb 17. Troponin elevated to 0.2. CXR showed no acute cardiopulmonary disease. EKG showed NSR, normal axis, no ST changes, . She was started on heparin  drip and cardiology was consulted. Patient was admitted to internal medicine for chest pain.   Meds:  Current Meds  Medication Sig  . albuterol (PROVENTIL HFA;VENTOLIN HFA) 108 (90 BASE) MCG/ACT inhaler Inhale 2 puffs into the lungs every 6 (six) hours as needed for wheezing or shortness of breath.  Marland Kitchen aspirin 81 MG chewable tablet Chew 324 mg by mouth once.  Marland Kitchen aspirin 81 MG chewable tablet Chew 81 mg by mouth daily.  . Cholecalciferol (VITAMIN D) 2000 UNITS tablet Take 2,000 Units by mouth daily.  Marland Kitchen ezetimibe-simvastatin (VYTORIN) 10-40 MG per tablet Take 1 tablet by mouth daily.  Marland Kitchen latanoprost (XALATAN) 0.005 % ophthalmic solution Place 1 drop into both eyes at bedtime.  . metFORMIN (GLUCOPHAGE) 500 MG tablet Take 500 mg by mouth 2 (two) times daily with a meal.  . mometasone-formoterol (DULERA) 100-5 MCG/ACT AERO Inhale 2 puffs into the lungs 2 (two) times daily.  . Omega-3 Fatty Acids (FISH OIL) 1000 MG CAPS Take 1,000 mg by mouth daily.  Marland Kitchen omeprazole (PRILOSEC) 20 MG capsule Take 20 mg by mouth daily before breakfast.  . simvastatin (ZOCOR) 40 MG tablet Take 40 mg by mouth at bedtime.  . valsartan-hydrochlorothiazide (DIOVAN-HCT) 160-12.5 MG per tablet Take 1 tablet by mouth daily.  . vitamin C (ASCORBIC ACID) 500 MG tablet Take 500 mg  by mouth daily.     Allergies: Allergies as of 04/17/2018 - Review Complete 04/23/2018  Allergen Reaction Noted  . Lisinopril Cough 11/02/2013   Past Medical History:  Diagnosis Date  . Diabetes mellitus without complication ([place])   . Hypertension   . Lung cancer ([place])     Family History: No significant family history.   Social History: Quit smoking 40 years ago, denies any EtOH or drug use. Lives at home with daughter.   Review of Systems: A complete ROS was negative except as per HPI.   Physical Exam: Blood pressure (!) 150/57, pulse (!) 53, temperature 98.5 F (36.9 C), temperature source Oral, resp. rate (!) 21, height 5\' 3"  (1.6 m),  weight 73.5 kg, SpO2 100 %. Physical Exam  Constitutional: She is oriented to person, place, and time and well-developed, well-nourished, and in no distress.  HENT:  Head: Normocephalic and atraumatic.  Eyes: Pupils are equal, round, and reactive to light. Conjunctivae and EOM are normal.  Neck: Normal range of motion. Neck supple. No thyromegaly present.  Cardiovascular: Normal rate, regular rhythm and normal heart sounds.  Pulmonary/Chest: Effort normal and breath sounds normal. No respiratory distress.  Abdominal: Soft. Bowel sounds are normal. She exhibits no distension.  Musculoskeletal: Normal range of motion.        General: No edema.  Neurological: She is alert and oriented to person, place, and time.  Skin: Skin is warm and dry. No erythema.  Psychiatric: Mood and affect normal.    EKG: personally reviewed my interpretation is normal sinus rhythm, normal sinus, prolonged PR interval, inverted T waves in leads I, aVL and V2, no ST elevation or depression  Assessment & Plan by Problem: Active Problems:   Chest pain  Chest pain: -85 year old female with history of hypertension, diabetes, and lung cancer who presented with typical chest pain that was sharp in nature, substernal,  relieved with nitroglycerin. She has been feeling unwell for a few days, including nausea, headaches, decreased appetite, dry cough and decreased energy.  He has some high risk features and heart score of 6.  On our evaluation she was chest pain-free.  On exam she was well-appearing, no acute distress, cardiac and pulmonary exam are unremarkable.  Labs are significant for a normal CBC, hypo-natremia of 196, metabolic acidosis, and AKI.  Initial troponin was elevated to 0.2, EKG showed first-degree heart block, normal sinus with new t wave inversions. She was started on a heparin drip and cardiology was consulted.  -Continue heparin drip -Trend troponins x3 -Repeat EKG in AM -Cardiology consulted, appreciate  recommendations - BMP - CBC - ASA 81 mg daily  Hypertension: -She is on Vytorin (exetimibe-simvastatin), and diovan (valasrtan-HCTZ) at home. On admission blood pressures were normal, however continued to increased.  -Restart home medications  Diabetes mellitus: She is on metformin at home. Hold this for now.  -SSI-I -Frequent CBGS   Asthma: -No wheezing on exam, denied any worsening shortness of breath. On albuterol and dulera at home.  -Continue dulera  FEN: No fluids, replete lytes prn, NPO, pending cardiology evaluation VTE ppx: Heparin drip Code Status: FULL    Dispo: Admit patient to Observation with expected length of stay less than 2 midnights.  Signed: Asencion Noble, MD 04/05/2018, 3:28 PM  Pager: [phone]

## 2018-04-11 NOTE — ED Provider Notes (Signed)
  Face-to-face evaluation   History: She presents for evaluation of nausea followed by sharp chest pain, earlier this morning.  Pain persisted until she was given aspirin and nitroglycerin, by EMS.  She feels like the second nitro took the pain and the nausea away.  No other similar problem.  No prior cardiac disease.  Physical exam: Alert elderly female who is calm and comfortable.  Heart regular rate and rhythm without murmur.  Lungs clear to auscultation.  Abdomen soft with mild epigastric tenderness.  Lower legs nontender without edema.  MDM-chest pain with some unchanged EKG from prior, likely of LVH.  Initial troponin elevated, heart pathway score increased at 6.  Patient will require further treatment and evaluation by cardiology.  Start heparin, nitro, PRN.  Medical screening examination/treatment/procedure(s) were conducted as a shared visit with non-physician practitioner(s) and myself.  I personally evaluated the patient during the encounter    Daleen Bo, MD 04/05/2018 [phone]

## 2018-04-11 NOTE — ED Provider Notes (Signed)
[facility] EMERGENCY DEPARTMENT Provider Note   CSN: [id number] Arrival date & time: 04/17/2018  1312    History   Chief Complaint No chief complaint on file.   HPI Tammie Rios is a 85 y.o. female   Patient reports that she developed chest pain at 12:00 PM today.  Describes it as a central pressure that was moderate intensity constant and associated with nausea.  EMS was called and she received 324 mg aspirin as well as 2 nitroglycerin with resolution of her pain.  Upon initial evaluation patient is chest pain-free.    Patient lives at home with her daughter who is at bedside who is provide supplemental history.  Patient reports intermittently productive cough, denies fever or shortness of breath that has been continuous since onset.  Of note patient without high risk travel behaviors or contact with high-risk individuals for COVID-19.    HPI  Past Medical History:  Diagnosis Date  . Diabetes mellitus without complication ([place])   . Hypertension   . Lung cancer [facility])     Patient Active Problem List   Diagnosis Date Noted  . Chest pain 04/01/2018    Past Surgical History:  Procedure Laterality Date  . CHOLECYSTECTOMY       OB History   No obstetric history on file.      Home Medications    Prior to Admission medications   Medication Sig Start Date End Date Taking? Authorizing Provider  albuterol (PROVENTIL HFA;VENTOLIN HFA) 108 (90 BASE) MCG/ACT inhaler Inhale 2 puffs into the lungs every 6 (six) hours as needed for wheezing or shortness of breath.   Yes [provider]  aspirin 81 MG chewable tablet Chew 324 mg by mouth once.   Yes [provider]  aspirin 81 MG chewable tablet Chew 81 mg by mouth daily.   Yes [provider]  Cholecalciferol (VITAMIN D) 2000 UNITS tablet Take 2,000 Units by mouth daily.   Yes [provider]  ezetimibe-simvastatin (VYTORIN) 10-40 MG per tablet Take 1 tablet by mouth daily.    Yes [provider]  latanoprost (XALATAN) 0.005 % ophthalmic solution Place 1 drop into both eyes at bedtime.   Yes [provider]  metFORMIN (GLUCOPHAGE) 500 MG tablet Take 500 mg by mouth 2 (two) times daily with a meal.   Yes [provider]  mometasone-formoterol (DULERA) 100-5 MCG/ACT AERO Inhale 2 puffs into the lungs 2 (two) times daily.   Yes [provider]  Omega-3 Fatty Acids (FISH OIL) 1000 MG CAPS Take 1,000 mg by mouth daily.   Yes [provider]  omeprazole (PRILOSEC) 20 MG capsule Take 20 mg by mouth daily before breakfast.   Yes [provider]  simvastatin (ZOCOR) 40 MG tablet Take 40 mg by mouth at bedtime. 03/22/18  Yes [provider]  valsartan-hydrochlorothiazide (DIOVAN-HCT) 160-12.5 MG per tablet Take 1 tablet by mouth daily.   Yes [provider]  vitamin C (ASCORBIC ACID) 500 MG tablet Take 500 mg by mouth daily.   Yes [provider]  ondansetron (ZOFRAN) 4 MG tablet Take 1 tablet (4 mg total) by mouth every 6 (six) hours. Patient not taking: Reported on 04/06/2018 11/02/13   Dahlia Bailiff, PA-C    Family History No family history on file.  Social History Social History   Tobacco Use  . Smoking status: Never Smoker  Substance Use Topics  . Alcohol use: No  . Drug use: No  Allergies   Lisinopril   Review of Systems Review of Systems  Constitutional: Negative.  Negative for chills and fever.  Respiratory: Positive for cough. Negative for shortness of breath.   Cardiovascular: Positive for chest pain. Negative for palpitations and leg swelling.  Gastrointestinal: Positive for nausea. Negative for abdominal pain, diarrhea and vomiting.  Musculoskeletal: Negative.  Negative for arthralgias and myalgias.  Neurological: Negative.  Negative for syncope, weakness and headaches.  All other systems reviewed and are negative.  Physical Exam Updated Vital Signs BP (!) 150/57    Pulse (!) 53   Temp 98.5 F (36.9 C) (Oral)   Resp (!) 21   Ht 5\' 3"  (1.6 m)   Wt 73.5 kg   SpO2 100%   BMI 28.70 kg/m   Physical Exam Constitutional:      General: She is not in acute distress.    Appearance: Normal appearance. She is well-developed. She is not ill-appearing or diaphoretic.  HENT:     Head: Normocephalic and atraumatic.     Right Ear: External ear normal.     Left Ear: External ear normal.     Nose: Nose normal.     Mouth/Throat:     Mouth: Mucous membranes are moist.     Pharynx: Oropharynx is clear.  Eyes:     General: Vision grossly intact. Gaze aligned appropriately.     Pupils: Pupils are equal, round, and reactive to light.  Neck:     Musculoskeletal: Normal range of motion.     Trachea: Trachea and phonation normal. No tracheal deviation.  Cardiovascular:     Rate and Rhythm: Normal rate and regular rhythm.     Pulses: Normal pulses.          Radial pulses are 2+ on the right side and 2+ on the left side.       Dorsalis pedis pulses are 2+ on the right side and 2+ on the left side.       Posterior tibial pulses are 2+ on the right side and 2+ on the left side.     Heart sounds: Normal heart sounds.  Pulmonary:     Effort: Pulmonary effort is normal. No respiratory distress.     Breath sounds: Normal breath sounds and air entry. No rhonchi.  Chest:     Chest wall: No tenderness.  Abdominal:     General: There is no distension.     Palpations: Abdomen is soft.     Tenderness: There is no abdominal tenderness. There is no guarding or rebound.  Musculoskeletal: Normal range of motion.     Right lower leg: Normal. She exhibits no tenderness. No edema.     Left lower leg: Normal. She exhibits no tenderness. No edema.  Skin:    General: Skin is warm and dry.  Neurological:     Mental Status: She is alert.     GCS: GCS eye subscore is 4. GCS verbal subscore is 5. GCS motor subscore is 6.     Comments: Speech is clear and goal oriented, follows  commands Major Cranial nerves without deficit, no facial droop Moves extremities without ataxia, coordination intact  Psychiatric:        Behavior: Behavior normal.      ED Treatments / Results  Labs (all labs ordered are listed, but only abnormal results are displayed) Labs Reviewed  BASIC METABOLIC PANEL - Abnormal; Notable for the following components:      Result Value   Sodium 133 (*)  CO2 17 (*)    Glucose, Bld 143 (*)    Creatinine, Ser 1.19 (*)    GFR calc non Af Amer 40 (*)    GFR calc Af Amer 46 (*)    All other components within normal limits  CBC WITH DIFFERENTIAL/PLATELET - Abnormal; Notable for the following components:   Hemoglobin 11.4 (*)    All other components within normal limits  TROPONIN I - Abnormal; Notable for the following components:   Troponin I 0.17 (*)    All other components within normal limits  I-STAT TROPONIN, ED - Abnormal; Notable for the following components:   Troponin i, poc 0.20 (*)    All other components within normal limits  CBC WITH DIFFERENTIAL/PLATELET  HEPARIN LEVEL (UNFRACTIONATED)    EKG EKG Interpretation  Date/Time:  Wednesday April 11 2018 13:24:48 EDT Ventricular Rate:  67 PR Interval:    QRS Duration: 92 QT Interval:  412 QTC Calculation: 435 R Axis:   37 Text Interpretation:  Sinus rhythm Prolonged PR interval Low voltage, precordial leads Probable LVH with secondary repol abnrm Since last tracing LVH and repolarization abnormality is new Confirmed by Daleen Bo [phone]) on 04/09/2018 2:16:41 PM   Radiology Dg Chest Portable 1 View  Result Date: 04/07/2018 CLINICAL DATA:  85 year old female with nonproductive cough for 6 days. Central chest pain. EXAM: PORTABLE CHEST 1 VIEW COMPARISON:  Chest radiographs 05/03/2017 and earlier. FINDINGS: Portable AP semi upright view at 1357 hours. Lung volumes and mediastinal contours remain normal. Visualized tracheal air column is within normal limits. Allowing for  portable technique the lungs are clear. Paucity of bowel gas in the upper abdomen. No acute osseous abnormality identified. Stable cholecystectomy clips.  Calcified aortic atherosclerosis. IMPRESSION: No acute cardiopulmonary abnormality. Electronically Signed   By: Genevie Ann M.D.   On: 04/10/2018 14:18    Procedures .Critical Care Performed by: Deliah Boston, PA-C Authorized by: Deliah Boston, PA-C   Critical care provider statement:    Critical care time (minutes):  35   Critical care was necessary to treat or prevent imminent or life-threatening deterioration of the following conditions:  Cardiac failure (Positive troponin, heparin given)   Critical care was time spent personally by me on the following activities:  Discussions with consultants, evaluation of patient's response to treatment, examination of patient, ordering and performing treatments and interventions, ordering and review of laboratory studies, ordering and review of radiographic studies, pulse oximetry, re-evaluation of patient's condition, obtaining history from patient or surrogate, review of old charts and development of treatment plan with patient or surrogate   (including critical care time)  Medications Ordered in ED Medications  heparin ADULT infusion 100 units/mL (25000 units/268mL sodium chloride 0.45%) (800 Units/hr Intravenous New Bag/Given 04/09/2018 1558)  heparin bolus via infusion 4,000 Units (4,000 Units Intravenous Bolus from Bag 04/14/2018 1559)     Initial Impression / Assessment and Plan / ED Course  I have reviewed the triage vital signs and the nursing notes.  Pertinent labs & imaging results that were available during my care of the patient were reviewed by me and considered in my medical decision making (see chart for details).    85 year old female arrives with chest pain that began around 12:00 PM today resolved prior to arrival with aspirin and nitroglycerin by EMS.  On arrival patient  overall well-appearing in no acute distress.  Afebrile, not tachycardic, not hypotensive with SPO2 of 100% on room air.  Patient is without shortness of breath or  hemoptysis. - EKG:  Sinus rhythm Prolonged PR interval Low voltage, precordial leads Probable LVH with secondary repol abnrm Since last tracing LVH and repolarization abnormality is new Confirmed by Daleen Bo  Chest x-ray: IMPRESSION: No acute cardiopulmonary abnormality.   Troponin: 0.20  CBC unremarkable BMP with mildly elevated creatinine - Consult called to cardiology, Trish, who advises admission to hospitalist service. - Patient reassessed, resting comfortably no acute distress.  Denies chest pain or shortness of breath at this time.  States that she is feeling well and is without complaint.  Patient understands plan of care and is agreeable for admission.  Patient seen and evaluated by Dr. Eulis Foster.  Heparin ordered, consult called for admission.  Discussed case with hospitalist who has admitted patient to their service.  Note: Portions of this report may have been transcribed using voice recognition software. Every effort was made to ensure accuracy; however, inadvertent computerized transcription errors may still be present.  Final Clinical Impressions(s) / ED Diagnoses   Final diagnoses:  Chest pain, unspecified type  Elevated troponin    ED Discharge Orders    None       Gari Crown 04/18/2018 1633    Daleen Bo, MD 04/23/2018 1722

## 2018-04-11 NOTE — ED Notes (Addendum)
Lab results was reported to Nurse Mallory.

## 2018-04-12 ENCOUNTER — Observation Stay (HOSPITAL_BASED_OUTPATIENT_CLINIC_OR_DEPARTMENT_OTHER): Payer: Medicare Other

## 2018-04-12 ENCOUNTER — Encounter (HOSPITAL_COMMUNITY): Admission: EM | Disposition: E | Payer: Self-pay | Source: Home / Self Care | Attending: Internal Medicine

## 2018-04-12 DIAGNOSIS — Z539 Procedure and treatment not carried out, unspecified reason: Secondary | ICD-10-CM | POA: Diagnosis present

## 2018-04-12 DIAGNOSIS — Z85118 Personal history of other malignant neoplasm of bronchus and lung: Secondary | ICD-10-CM

## 2018-04-12 DIAGNOSIS — R7989 Other specified abnormal findings of blood chemistry: Secondary | ICD-10-CM

## 2018-04-12 DIAGNOSIS — I214 Non-ST elevation (NSTEMI) myocardial infarction: Secondary | ICD-10-CM | POA: Diagnosis present

## 2018-04-12 DIAGNOSIS — R001 Bradycardia, unspecified: Secondary | ICD-10-CM | POA: Diagnosis present

## 2018-04-12 DIAGNOSIS — Z79899 Other long term (current) drug therapy: Secondary | ICD-10-CM | POA: Diagnosis not present

## 2018-04-12 DIAGNOSIS — Z7984 Long term (current) use of oral hypoglycemic drugs: Secondary | ICD-10-CM

## 2018-04-12 DIAGNOSIS — Z66 Do not resuscitate: Secondary | ICD-10-CM | POA: Diagnosis present

## 2018-04-12 DIAGNOSIS — R0603 Acute respiratory distress: Secondary | ICD-10-CM | POA: Diagnosis not present

## 2018-04-12 DIAGNOSIS — I440 Atrioventricular block, first degree: Secondary | ICD-10-CM | POA: Diagnosis present

## 2018-04-12 DIAGNOSIS — E871 Hypo-osmolality and hyponatremia: Secondary | ICD-10-CM | POA: Diagnosis present

## 2018-04-12 DIAGNOSIS — E872 Acidosis: Secondary | ICD-10-CM | POA: Diagnosis present

## 2018-04-12 DIAGNOSIS — R0902 Hypoxemia: Secondary | ICD-10-CM | POA: Diagnosis not present

## 2018-04-12 DIAGNOSIS — R110 Nausea: Secondary | ICD-10-CM

## 2018-04-12 DIAGNOSIS — E119 Type 2 diabetes mellitus without complications: Secondary | ICD-10-CM | POA: Diagnosis present

## 2018-04-12 DIAGNOSIS — E1159 Type 2 diabetes mellitus with other circulatory complications: Secondary | ICD-10-CM | POA: Diagnosis not present

## 2018-04-12 DIAGNOSIS — Z87891 Personal history of nicotine dependence: Secondary | ICD-10-CM | POA: Diagnosis not present

## 2018-04-12 DIAGNOSIS — I5031 Acute diastolic (congestive) heart failure: Secondary | ICD-10-CM | POA: Diagnosis not present

## 2018-04-12 DIAGNOSIS — R079 Chest pain, unspecified: Secondary | ICD-10-CM | POA: Diagnosis not present

## 2018-04-12 DIAGNOSIS — I110 Hypertensive heart disease with heart failure: Secondary | ICD-10-CM | POA: Diagnosis present

## 2018-04-12 DIAGNOSIS — N179 Acute kidney failure, unspecified: Secondary | ICD-10-CM

## 2018-04-12 DIAGNOSIS — Z888 Allergy status to other drugs, medicaments and biological substances status: Secondary | ICD-10-CM

## 2018-04-12 DIAGNOSIS — D509 Iron deficiency anemia, unspecified: Secondary | ICD-10-CM | POA: Diagnosis present

## 2018-04-12 DIAGNOSIS — I209 Angina pectoris, unspecified: Secondary | ICD-10-CM | POA: Diagnosis not present

## 2018-04-12 DIAGNOSIS — I10 Essential (primary) hypertension: Secondary | ICD-10-CM

## 2018-04-12 DIAGNOSIS — J9601 Acute respiratory failure with hypoxia: Secondary | ICD-10-CM | POA: Diagnosis not present

## 2018-04-12 DIAGNOSIS — I959 Hypotension, unspecified: Secondary | ICD-10-CM | POA: Diagnosis present

## 2018-04-12 DIAGNOSIS — Z7951 Long term (current) use of inhaled steroids: Secondary | ICD-10-CM

## 2018-04-12 DIAGNOSIS — Z515 Encounter for palliative care: Secondary | ICD-10-CM | POA: Diagnosis present

## 2018-04-12 DIAGNOSIS — Z923 Personal history of irradiation: Secondary | ICD-10-CM | POA: Diagnosis not present

## 2018-04-12 DIAGNOSIS — I25110 Atherosclerotic heart disease of native coronary artery with unstable angina pectoris: Secondary | ICD-10-CM | POA: Diagnosis not present

## 2018-04-12 DIAGNOSIS — F419 Anxiety disorder, unspecified: Secondary | ICD-10-CM | POA: Diagnosis present

## 2018-04-12 DIAGNOSIS — J45909 Unspecified asthma, uncomplicated: Secondary | ICD-10-CM | POA: Diagnosis present

## 2018-04-12 DIAGNOSIS — I2510 Atherosclerotic heart disease of native coronary artery without angina pectoris: Secondary | ICD-10-CM | POA: Diagnosis not present

## 2018-04-12 DIAGNOSIS — Z7189 Other specified counseling: Secondary | ICD-10-CM | POA: Diagnosis not present

## 2018-04-12 DIAGNOSIS — Z7982 Long term (current) use of aspirin: Secondary | ICD-10-CM | POA: Diagnosis not present

## 2018-04-12 HISTORY — PX: LEFT HEART CATH AND CORONARY ANGIOGRAPHY: CATH118249

## 2018-04-12 LAB — CBC
HCT: 34.8 % — ABNORMAL LOW (ref 36.0–46.0)
Hemoglobin: 11.5 g/dL — ABNORMAL LOW (ref 12.0–15.0)
MCH: 27 pg (ref 26.0–34.0)
MCHC: 33 g/dL (ref 30.0–36.0)
MCV: 81.7 fL (ref 80.0–100.0)
Platelets: 196 10*3/uL (ref 150–400)
RBC: 4.26 MIL/uL (ref 3.87–5.11)
RDW: 14.5 % (ref 11.5–15.5)
WBC: 4.3 10*3/uL (ref 4.0–10.5)
nRBC: 0 % (ref 0.0–0.2)

## 2018-04-12 LAB — GLUCOSE, CAPILLARY
Glucose-Capillary: 144 mg/dL — ABNORMAL HIGH (ref 70–99)
Glucose-Capillary: 120 mg/dL — ABNORMAL HIGH (ref 70–99)
Glucose-Capillary: 113 mg/dL — ABNORMAL HIGH (ref 70–99)
Glucose-Capillary: 125 mg/dL — ABNORMAL HIGH (ref 70–99)
Glucose-Capillary: 134 mg/dL — ABNORMAL HIGH (ref 70–99)

## 2018-04-12 LAB — HEPARIN LEVEL (UNFRACTIONATED)
Heparin Unfractionated: 0.61 IU/mL (ref 0.30–0.70)
Heparin Unfractionated: 0.59 IU/mL (ref 0.30–0.70)

## 2018-04-12 LAB — TROPONIN I
Troponin I: 0.62 ng/mL (ref <0.03)
Troponin I: 0.38 ng/mL (ref <0.03)
Troponin I: 0.25 ng/mL (ref <0.03)
Troponin I: 0.18 ng/mL (ref <0.03)

## 2018-04-12 LAB — POCT ACTIVATED CLOTTING TIME: Activated Clotting Time: 136 seconds

## 2018-04-12 LAB — ECHOCARDIOGRAM COMPLETE
Height: 63 in
Weight: 2472 oz

## 2018-04-12 SURGERY — LEFT HEART CATH AND CORONARY ANGIOGRAPHY
Anesthesia: LOCAL

## 2018-04-12 MED ORDER — ATORVASTATIN CALCIUM 80 MG PO TABS
80.0000 mg | ORAL_TABLET | Freq: Every day | ORAL | Status: DC
  Administered 2018-04-12 – 2018-04-16 (×4): 80 mg via ORAL
  Filled 2018-04-12 (×5): qty 1

## 2018-04-12 MED ORDER — SODIUM CHLORIDE 0.9% FLUSH: 3.0000 mL | INTRAVENOUS | Status: DC | PRN

## 2018-04-12 MED ORDER — VERAPAMIL HCL 2.5 MG/ML IV SOLN
INTRAVENOUS | Status: DC | PRN
  Administered 2018-04-12: 15:00:00 via INTRA_ARTERIAL

## 2018-04-12 MED ORDER — HEPARIN (PORCINE) IN NACL 1000-0.9 UT/500ML-% IV SOLN
INTRAVENOUS | Status: DC | PRN
  Administered 2018-04-12 (×3): 500 mL

## 2018-04-12 MED ORDER — ASPIRIN 81 MG PO CHEW
81.0000 mg | CHEWABLE_TABLET | Freq: Every day | ORAL | Status: DC
  Administered 2018-04-13: 81 mg via ORAL
  Filled 2018-04-12 (×2): qty 1

## 2018-04-12 MED ORDER — NITROGLYCERIN 0.4 MG SL SUBL
SUBLINGUAL_TABLET | SUBLINGUAL | Status: AC
  Filled 2018-04-12: qty 1

## 2018-04-12 MED ORDER — HEPARIN (PORCINE) IN NACL 1000-0.9 UT/500ML-% IV SOLN
INTRAVENOUS | Status: AC
  Filled 2018-04-12: qty 1000

## 2018-04-12 MED ORDER — ASPIRIN 81 MG PO CHEW: 81.0000 mg | CHEWABLE_TABLET | ORAL | Status: AC

## 2018-04-12 MED ORDER — FENTANYL CITRATE (PF) 100 MCG/2ML IJ SOLN
INTRAMUSCULAR | Status: AC
  Filled 2018-04-12: qty 2

## 2018-04-12 MED ORDER — SODIUM CHLORIDE 0.9% FLUSH
3.0000 mL | Freq: Two times a day (BID) | INTRAVENOUS | Status: DC
  Administered 2018-04-14 – 2018-04-15 (×2): 3 mL via INTRAVENOUS

## 2018-04-12 MED ORDER — HEPARIN (PORCINE) 25000 UT/250ML-% IV SOLN
700.0000 [IU]/h | INTRAVENOUS | Status: DC
  Administered 2018-04-13: 700 [IU]/h via INTRAVENOUS
  Filled 2018-04-12: qty 250

## 2018-04-12 MED ORDER — OXYCODONE HCL 5 MG PO TABS: 5.0000 mg | ORAL_TABLET | ORAL | Status: DC | PRN

## 2018-04-12 MED ORDER — NITROGLYCERIN 0.4 MG SL SUBL
0.4000 mg | SUBLINGUAL_TABLET | SUBLINGUAL | Status: DC | PRN
  Administered 2018-04-12 (×2): 0.4 mg via SUBLINGUAL

## 2018-04-12 MED ORDER — LIDOCAINE HCL (PF) 1 % IJ SOLN
INTRAMUSCULAR | Status: DC | PRN
  Administered 2018-04-12: 15 mL
  Administered 2018-04-12: 2 mL

## 2018-04-12 MED ORDER — MIDAZOLAM HCL 2 MG/2ML IJ SOLN
INTRAMUSCULAR | Status: DC | PRN
  Administered 2018-04-12 (×2): 0.5 mg via INTRAVENOUS

## 2018-04-12 MED ORDER — SODIUM CHLORIDE 0.9 % IV SOLN: 250.0000 mL | INTRAVENOUS | Status: DC | PRN

## 2018-04-12 MED ORDER — LIDOCAINE HCL (PF) 1 % IJ SOLN
INTRAMUSCULAR | Status: AC
  Filled 2018-04-12: qty 30

## 2018-04-12 MED ORDER — ONDANSETRON HCL 4 MG/2ML IJ SOLN
4.0000 mg | Freq: Four times a day (QID) | INTRAMUSCULAR | Status: DC | PRN
  Administered 2018-04-13: 4 mg via INTRAVENOUS
  Filled 2018-04-12: qty 2

## 2018-04-12 MED ORDER — SODIUM CHLORIDE 0.9 % WEIGHT BASED INFUSION: 1.0000 mL/kg/h | INTRAVENOUS | Status: DC

## 2018-04-12 MED ORDER — FENTANYL CITRATE (PF) 100 MCG/2ML IJ SOLN
INTRAMUSCULAR | Status: DC | PRN
  Administered 2018-04-12 (×2): 25 ug via INTRAVENOUS

## 2018-04-12 MED ORDER — HYDRALAZINE HCL 20 MG/ML IJ SOLN
INTRAMUSCULAR | Status: DC | PRN
  Administered 2018-04-12: 10 mg via INTRAVENOUS

## 2018-04-12 MED ORDER — IOHEXOL 350 MG/ML SOLN
INTRAVENOUS | Status: DC | PRN
  Administered 2018-04-12: 60 mL via INTRACARDIAC

## 2018-04-12 MED ORDER — ACETAMINOPHEN 325 MG PO TABS
650.0000 mg | ORAL_TABLET | ORAL | Status: DC | PRN
  Administered 2018-04-12: 650 mg via ORAL
  Filled 2018-04-12: qty 2

## 2018-04-12 MED ORDER — MIDAZOLAM HCL 2 MG/2ML IJ SOLN
INTRAMUSCULAR | Status: AC
  Filled 2018-04-12: qty 2

## 2018-04-12 MED ORDER — SODIUM CHLORIDE 0.9 % WEIGHT BASED INFUSION
3.0000 mL/kg/h | INTRAVENOUS | Status: DC
  Administered 2018-04-12: 3 mL/kg/h via INTRAVENOUS

## 2018-04-12 MED ORDER — NITROGLYCERIN IN D5W 200-5 MCG/ML-% IV SOLN
0.0000 ug/min | INTRAVENOUS | Status: DC
  Administered 2018-04-12: 5 ug/min via INTRAVENOUS
  Filled 2018-04-12: qty 250

## 2018-04-12 MED ORDER — VERAPAMIL HCL 2.5 MG/ML IV SOLN
INTRAVENOUS | Status: AC
  Filled 2018-04-12: qty 2

## 2018-04-12 MED ORDER — SODIUM CHLORIDE 0.9% FLUSH: 3.0000 mL | Freq: Two times a day (BID) | INTRAVENOUS | Status: DC

## 2018-04-12 SURGICAL SUPPLY — 14 items
CATH INFINITI 5 FR JL3.5 (CATHETERS) ×2 IMPLANT
CATH INFINITI 5FR MPB2 (CATHETERS) ×2 IMPLANT
CATH INFINITI JR4 5F (CATHETERS) ×2 IMPLANT
DEVICE RAD COMP TR BAND LRG (VASCULAR PRODUCTS) ×2 IMPLANT
GLIDESHEATH SLEND A-KIT 6F 22G (SHEATH) ×2 IMPLANT
GLIDESHEATH SLEND SS 6F .021 (SHEATH) IMPLANT
GUIDEWIRE INQWIRE 1.5J.035X260 (WIRE) ×1 IMPLANT
INQWIRE 1.5J .035X260CM (WIRE) ×2
KIT HEART LEFT (KITS) ×2 IMPLANT
PACK CARDIAC CATHETERIZATION (CUSTOM PROCEDURE TRAY) ×2 IMPLANT
SHEATH PINNACLE 5F 10CM (SHEATH) ×2 IMPLANT
TRANSDUCER W/STOPCOCK (MISCELLANEOUS) ×2 IMPLANT
TUBING CIL FLEX 10 FLL-RA (TUBING) ×2 IMPLANT
WIRE HI TORQ VERSACORE-J 145CM (WIRE) ×2 IMPLANT

## 2018-04-12 NOTE — CV Procedure (Addendum)
   Coronary angiography via right radial approach failed due to significant subclavian / innominate / aortic tortuosity and inability to torque catheter.  This required switch over to right femoral.  Radial access with with vascular ultrasound.  Right femoral access with vascular ultrasound guidance.  Severe two-vessel coronary disease with total occlusion of the distal RCA, and severe, calcified, tandem proximal to mid LAD stenoses with the region of superimposed ectasia/fusiform aneurysm.  Circumflex is large and without significant obstruction.  Circumflex and LAD supplied collaterals to the PDA.  Normal LVEDP.  EF greater than 60% by echo.  PCI could be attempted, would be high risk due to heavy calcification, tortuosity beyond lesion in the LAD that may impact distal wire position, and if unable to use atherectomy because of aneurysm, there would be possibility of non-expansion and therefore inability to stent.  Spoke with Dr. Sallyanne Kuster, and decision made to pause, consult surgical colleagues (I do not believe she will be a surgical candidate), and speak with family and patient in frank terms concerning the procedure and risks.

## 2018-04-12 NOTE — Progress Notes (Signed)
  Echocardiogram 2D Echocardiogram has been performed.  Tammie Rios 03/28/2018, 9:56 AM

## 2018-04-12 NOTE — Consult Note (Addendum)
Cardiology Consultation:   Tammie Rios ID: NANCEY KREITZ MRN: [id number]; DOB: February 22, 1924  Admit date: 04/01/2018 Date of Consult: 04/18/2018  Primary Care Provider: Rodena Medin, MD (Inactive) Primary Cardiologist: New to [facility]   Tammie Rios Profile:   Tammie Rios is a 85 y.o. female with a hx of hypertension, diabetes, and deficiency anemia and lung cancer who is being seen today for the evaluation of non-STEMI at the request of Dr. Rebeca Alert.  No prior cardiac history.  Remote routine stress test was normal.  History of Present Illness:   Tammie Rios woke up feeling fatigue yesterday morning.  Then before breakfast Tammie Rios had substernal sharp chest pain.  Associated with severe shortness of breath and nausea.  No radiation or diaphoresis.  For the past week Tammie Rios been feeling fatigued and tired.  Intermittent cough.  No fever, chills, orthopnea, PND, syncope, palpitation, dizziness or melena.  Symptoms persisted for 30 minutes and EMS was called.  She received aspirin 324 mg and sublingual nitroglycerin x2.  Her pain resolved in ER.  No recurrence.  Troponin 0.02.  Started on heparin and admitted.  Troponin trending up (0.17>>0.34>>0.62>>0.38).  Creatinine 1.19.  Potassium 4.8.  Hemoglobin 11.5.  EKG this morning showed anterior lateral T wave inversion which is more pronounced/new compared to yesterday-personally reviewed.  Echocardiogram showed normal LV function at 60 to 65%, intermittent filling pressure.  No wall motion abnormality.  At baseline, Tammie Rios is somewhat active.  Lives with daughter.  Walks around the house, helps with cooking and goes for grocery shopping occasionally.   Past Medical History:  Diagnosis Date  . Diabetes mellitus without complication ([place])   . Hypertension   . Lung cancer [facility])     Past Surgical History:  Procedure Laterality Date  . CHOLECYSTECTOMY       Inpatient Medications: Scheduled Meds: . aspirin  81 mg Oral Daily  . cholecalciferol  2,000  Units Oral Daily  . ezetimibe-simvastatin  1 tablet Oral Daily  . hydrochlorothiazide  12.5 mg Oral Daily  . insulin aspart  0-15 Units Subcutaneous TID WC  . latanoprost  1 drop Both Eyes QHS  . mometasone-formoterol  2 puff Inhalation BID  . omega-3 acid ethyl esters  1 g Oral Daily  . pantoprazole  40 mg Oral Daily  . valsartan  160 mg Oral Daily  . vitamin C  500 mg Oral Daily   Continuous Infusions: . heparin 800 Units/hr (03/31/2018 1558)   PRN Meds: acetaminophen, albuterol, ondansetron (ZOFRAN) IV  Allergies:    Allergies  Allergen Reactions  . Lisinopril Cough    Social History:   Social History   Socioeconomic History  . Marital status: Widowed    Spouse name: Not on file  . Number of children: Not on file  . Years of education: Not on file  . Highest education level: Not on file  Occupational History  . Not on file  Social Needs  . Financial resource strain: Not on file  . Food insecurity:    Worry: Not on file    Inability: Not on file  . Transportation needs:    Medical: Not on file    Non-medical: Not on file  Tobacco Use  . Smoking status: Never Smoker  Substance and Sexual Activity  . Alcohol use: No  . Drug use: No  . Sexual activity: Not on file  Lifestyle  . Physical activity:    Days per week: Not on file    Minutes per session: Not on  file  . Stress: Not on file  Relationships  . Social connections:    Talks on phone: Not on file    Gets together: Not on file    Attends religious service: Not on file    Active member of club or organization: Not on file    Attends meetings of clubs or organizations: Not on file    Relationship status: Not on file  . Intimate partner violence:    Fear of current or ex partner: Not on file    Emotionally abused: Not on file    Physically abused: Not on file    Forced sexual activity: Not on file  Other Topics Concern  . Not on file  Social History Narrative  . Not on file    Family History:    Denies family history of CAD  ROS:  Please see the history of present illness.  All other ROS reviewed and negative.     Physical Exam/Data:   Vitals:   04/14/2018 2200 04/22/2018 0328 03/27/2018 1016 04/20/2018 1117  BP: (!) 104/92 (!) 162/92 (!) 155/71   Pulse: 80 72  71  Resp: 20 16  16   Temp: 99.1 F (37.3 C) 99.4 F (37.4 C)    TempSrc: Oral Oral    SpO2: 98% 100%  98%  Weight:  70.1 kg    Height:        Intake/Output Summary (Last 24 hours) at 04/04/2018 1211 Last data filed at 04/06/2018 0300 Gross per 24 hour  Intake 127.8 ml  Output -  Net 127.8 ml   Last 3 Weights 04/20/2018 04/01/2018 04/07/2018  Weight (lbs) 154 lb 8 oz 162 lb 0.6 oz 162 lb  Weight (kg) 70.081 kg 73.5 kg 73.483 kg     Body mass index is 27.37 kg/m.  General:  Well nourished, well developed, in no acute distress HEENT: normal Lymph: no adenopathy Neck: no JVD Endocrine:  No thryomegaly Vascular: No carotid bruits; FA pulses 2+ bilaterally without bruits  Cardiac:  normal S1, S2; RRR; no murmur  Lungs:  clear to auscultation bilaterally, no wheezing, rhonchi or rales  Abd: soft, nontender, no hepatomegaly  Ext: no edema Musculoskeletal:  No deformities, BUE and BLE strength normal and equal Skin: warm and dry  Neuro:  CNs 2-12 intact, no focal abnormalities noted Psych:  Normal affect   Telemetry:  Telemetry was personally reviewed and demonstrates: Normal sinus rhythm  Relevant CV Studies:  Echo 04/03/2018   1. The left ventricle has normal systolic function with an ejection fraction of 60-65%. The cavity size was normal. There is mildly increased left ventricular wall thickness. Left ventricular diastolic Doppler parameters are consistent with impaired  relaxation. Indeterminate filling pressures The E/e' is 8-15. No evidence of left ventricular regional wall motion abnormalities.  2. The right ventricle has normal systolic function. The cavity was normal. There is no increase in right  ventricular wall thickness.  3. The mitral valve is degenerative. Mild thickening of the mitral valve leaflet. Mild calcification of the mitral valve leaflet.  4. The aortic valve is tricuspid.  5. The aortic root and ascending aorta are normal in size and structure.  6. The inferior vena cava was normal in size with <50% respiratory variability.   Laboratory Data:  Chemistry Recent Labs  Lab 04/10/2018 1418  NA 133*  K 4.8  CL 105  CO2 17*  GLUCOSE 143*  BUN 19  CREATININE 1.19*  CALCIUM 9.2  GFRNONAA 40*  GFRAA 46*  ANIONGAP 11     Hematology Recent Labs  Lab 03/31/2018 1452 04/15/2018 0334  WBC 5.2 4.3  RBC 4.26 4.26  HGB 11.4* 11.5*  HCT 36.1 34.8*  MCV 84.7 81.7  MCH 26.8 27.0  MCHC 31.6 33.0  RDW 14.3 14.5  PLT 178 196   Cardiac Enzymes Recent Labs  Lab 03/28/2018 1452 04/17/2018 2109 03/31/2018 0334 03/30/2018 1105  TROPONINI 0.17* 0.34* 0.62* 0.38*    Recent Labs  Lab 04/21/2018 1423  TROPIPOC 0.20*    Radiology/Studies:  Dg Chest Portable 1 View  Result Date: 04/01/2018 CLINICAL DATA:  85 year old female with nonproductive cough for 6 days. Central chest pain. EXAM: PORTABLE CHEST 1 VIEW COMPARISON:  Chest radiographs 05/03/2017 and earlier. FINDINGS: Portable AP semi upright view at 1357 hours. Lung volumes and mediastinal contours remain normal. Visualized tracheal air column is within normal limits. Allowing for portable technique the lungs are clear. Paucity of bowel gas in the upper abdomen. No acute osseous abnormality identified. Stable cholecystectomy clips.  Calcified aortic atherosclerosis. IMPRESSION: No acute cardiopulmonary abnormality. Electronically Signed   By: Genevie Ann M.D.   On: 04/02/2018 14:18    Assessment and Plan:   1. NSTEMI - Peak of troponin 0.62, now trending down.  Chest pain-free since got sublingual nitroglycerin x2 yesterday.  On IV heparin. Echo showed normal LVEF. No WM abnormality. EKG with new anterior lateral TWI.   -  Tammie Rios is 85 yr old, lives with daughter and somewhat functional.  - Will review with MD for best treatment option, medical versus cardiac cath. - Continue ASA, statin and heparin.   2. HTN - BP elevated at 150-160s. Continue home Diovan-HCT. Will avoid BB or any AV blocking agent given prolonged PR interval.   3. DM - Per primary   Keep NPO. Dr. Sallyanne Kuster to see.   For questions or updates, please contact [place] Please consult [email or web address] for contact info under   Jarrett Soho, PA  04/11/2018 12:11 PM   I have seen and examined the Tammie Rios along with Leanor Kail, PA   I have reviewed the chart, notes and new data.  I agree with PA/NP's note.  Key new complaints: Presentation is highly consistent with unstable angina/small acute  myocardial infarction (non-ST segment elevation).  Severe chest pain lasted for about 35-40 minutes, improved after sublingual nitroglycerin.  Currently asymptomatic.  Despite her advanced age she is quite functional and has good quality of life. Key examination changes: Normal cardiovascular exam. Key new findings / data: ECG suggests extensive anterior and anterolateral ischemia, suspicion for proximal LAD artery stenosis, stable multivessel CAD.  Borderline renal function parameters.  Has had numerous previous contrast based imaging studies for follow-up of her lung cancer.  PLAN: Recommend urgent coronary angiography and revascularization as appropriate.  The threshold for coronary bypass surgery would be set very high due to her advanced age and previous chest surgery for lung cancer.  Even if she has multivessel CAD, it may be best to treat with percutaneous revascularization of the culprit lesion followed by medical therapy for any other lesions. Her diabetes is well controlled. Discussed both the diagnostic coronary angiography and possible percutaneous revascularization with angioplasty and stent in detail with the Tammie Rios  and her 2 daughters. This procedure has been fully reviewed with the Tammie Rios and written informed consent has been obtained. Continue intravenous heparin until ready for procedure.  has received aspirin. Very long first-degree AV block (PR 380 ms).  We will not administer beta-blockers.  High-dose statin.  Sanda Klein, MD, [place] [phone] 03/26/2018, 1:09 PM

## 2018-04-12 NOTE — Progress Notes (Signed)
ANTICOAGULATION CONSULT NOTE - [place] for heparin Indication: chest pain/ACS  Allergies  Allergen Reactions  . Lisinopril Cough    Patient Measurements: Height: 5\' 3"  (160 cm) Weight: 154 lb 8 oz (70.1 kg) IBW/kg (Calculated) : 52.4 Heparin Dosing Weight: 68kg  Vital Signs: Temp: 99.4 F (37.4 C) (03/19 0328) Temp Source: Oral (03/19 0328) BP: 162/92 (03/19 0328) Pulse Rate: 72 (03/19 0328)  Labs: Recent Labs    04/02/2018 1418 03/26/2018 1452 04/07/2018 2109 04/22/2018 2312 04/01/2018 0334  HGB  --  11.4*  --   --  11.5*  HCT  --  36.1  --   --  34.8*  PLT  --  178  --   --  196  HEPARINUNFRC  --   --   --  0.61 0.59  CREATININE 1.19*  --   --   --   --   TROPONINI  --  0.17* 0.34*  --  0.62*    Estimated Creatinine Clearance: 28.9 mL/min (A) (by C-G formula based on SCr of 1.19 mg/dL (H)).   Medical History: Past Medical History:  Diagnosis Date  . Diabetes mellitus without complication ([place])   . Hypertension   . Lung cancer ([place])    Assessment: 73 YOF presenting with CP, initial troponin elevated, no anticoagulation PTA.  Heparin remains therapeutic, CBC stable.  Goal of Therapy:  Heparin level 0.3-0.7 units/ml Monitor platelets by anticoagulation protocol: Yes   Plan:  -Continue heparin 800 units/hr -Daily heparin level and CBC  Arrie Senate, PharmD, BCPS Clinical Pharmacist [phone] Please check AMION for all [place] numbers 04/10/2018

## 2018-04-12 NOTE — Progress Notes (Signed)
Pt c/o mid-sternal pressure chest pain at 1830 of a 8 out of 10 scale.  NTG 0.4mg  SL given with BP 156/70.  No relief of cp noted.  2nd NTG given, no relief, bp dropped to 81/45.  BS 125.  EKG obtained and MD notified.  Pt cont to have no relief of cp for 36min then c/o HA and chest pain subsided.  Tylenol given.  BP 118/50 at this time.  Will cont to monitor.

## 2018-04-12 NOTE — Interval H&P Note (Signed)
Cath Lab Visit (complete for each Cath Lab visit)  Clinical Evaluation Leading to the Procedure:   ACS: Yes.    Non-ACS:    Anginal Classification: CCS IV  Anti-ischemic medical therapy: Minimal Therapy (1 class of medications)  Non-Invasive Test Results: No non-invasive testing performed  Prior CABG: No previous CABG      History and Physical Interval Note:  04/22/2018 2:24 PM  Robyne Askew  has presented today for surgery, with the diagnosis of non stemi.  The various methods of treatment have been discussed with the patient and family. After consideration of risks, benefits and other options for treatment, the patient has consented to  Procedure(s): LEFT HEART CATH AND CORONARY ANGIOGRAPHY (N/A) as a surgical intervention.  The patient's history has been reviewed, patient examined, no change in status, stable for surgery.  I have reviewed the patient's chart and labs.  Questions were answered to the patient's satisfaction.     Belva Crome III

## 2018-04-12 NOTE — Consult Note (Signed)
[address]       [name] [address]             [phone]      Cardiothoracic Surgery Consultation  Reason for Consult: Severe multi-vessel coronary artery disease s/p NSTEMI Referring Physician: Dr. Glori Bickers is an 85 y.o. female.  HPI:   The patient is a 85 year old woman with a history of diabetes, hypertension, iron deficiency anemia, and history of lung cancer status post XRT at [facility] 10 years ago.  She presents with a recent history of fatigue and substernal chest pain occurring with exertion as well as at rest.  This was associated with severe shortness of breath and nausea.  She said that for the past week or so she has been feeling very tired.  She was brought in by EMS and her pain resolved in the emergency room.  Her troponin was initially 0.02 and trended upward to 0.62.  An electrocardiogram showed anterior lateral T wave inversion.  Echocardiogram showed normal left ventricular function with ejection fraction of 60 to 65%.  Cardiac catheterization today showed a complex proximal and mid 99% LAD stenosis followed by ectasia/aneurysm and then followed by a 90% stenosis at the bifurcation of the first diagonal branch.  The distal LAD is a large vessel that wraps around the apex.  Left circumflex was widely patent and supplied collaterals to the PDA along with the LAD.  The right coronary artery was heavily calcified and totally occluded distally with faint filling of a small posterior descending branch by collaterals from the left.  LVEDP was 18.  The patient lives with her daughter who is here with her now.  The patient has remained somewhat active around the house and gets out occasionally to go to the store with her daughter.  Past Medical History:  Diagnosis Date  . Diabetes mellitus without complication ([name])   . Hypertension   . Lung cancer ([name]) she had what sounds like SBRT to a small lesion in the left lower lobe  lung.  She has been followed periodically at [facility].  She has had no evidence of recurrence.     Past Surgical History:  Procedure Laterality Date  . CHOLECYSTECTOMY      No family history on file.  Social History:  reports that she has never smoked. She does not have any smokeless tobacco history on file. She reports that she does not drink alcohol or use drugs.  Allergies:  Allergies  Allergen Reactions  . Lisinopril Cough    Medications:  I have reviewed the patient's current medications. Prior to Admission:  Medications Prior to Admission  Medication Sig Dispense Refill Last Dose  . albuterol (PROVENTIL HFA;VENTOLIN HFA) 108 (90 BASE) MCG/ACT inhaler Inhale 2 puffs into the lungs every 6 (six) hours as needed for wheezing or shortness of breath.   04/10/2018 at Unknown time  . aspirin 81 MG chewable tablet Chew 324 mg by mouth once.   04/10/2018 at Unknown time  . aspirin 81 MG chewable tablet Chew 81 mg by mouth daily.   04/10/2018 at Unknown time  . Cholecalciferol (VITAMIN D) 2000 UNITS tablet Take 2,000 Units by mouth daily.   04/10/2018 at Unknown time  . ezetimibe-simvastatin (VYTORIN) 10-40 MG per tablet Take 1 tablet by mouth daily.   04/10/2018 at Unknown time  . latanoprost (XALATAN) 0.005 % ophthalmic solution Place 1 drop into both eyes at bedtime.   04/10/2018  at Unknown time  . metFORMIN (GLUCOPHAGE) 500 MG tablet Take 500 mg by mouth 2 (two) times daily with a meal.   04/10/2018 at Unknown time  . mometasone-formoterol (DULERA) 100-5 MCG/ACT AERO Inhale 2 puffs into the lungs 2 (two) times daily.   04/10/2018 at Unknown time  . Omega-3 Fatty Acids (FISH OIL) 1000 MG CAPS Take 1,000 mg by mouth daily.   04/10/2018 at Unknown time  . omeprazole (PRILOSEC) 20 MG capsule Take 20 mg by mouth daily before breakfast.   04/08/2018 at Unknown time  . simvastatin (ZOCOR) 40 MG tablet Take 40 mg by mouth at bedtime.   04/10/2018 at Unknown time  . valsartan-hydrochlorothiazide  (DIOVAN-HCT) 160-12.5 MG per tablet Take 1 tablet by mouth daily.   04/10/2018 at 01000  . vitamin C (ASCORBIC ACID) 500 MG tablet Take 500 mg by mouth daily.   04/10/2018 at Unknown time  . ondansetron (ZOFRAN) 4 MG tablet Take 1 tablet (4 mg total) by mouth every 6 (six) hours. (Patient not taking: Reported on 03/31/2018) 12 tablet 0 Not Taking at Unknown time   Scheduled: . [START ON 04/18/2018] aspirin  81 mg Oral Daily  . atorvastatin  80 mg Oral q1800  . cholecalciferol  2,000 Units Oral Daily  . insulin aspart  0-15 Units Subcutaneous TID WC  . latanoprost  1 drop Both Eyes QHS  . mometasone-formoterol  2 puff Inhalation BID  . omega-3 acid ethyl esters  1 g Oral Daily  . pantoprazole  40 mg Oral Daily  . [START ON 04/04/2018] sodium chloride flush  3 mL Intravenous Q12H  . valsartan  160 mg Oral Daily  . vitamin C  500 mg Oral Daily   Continuous: . [START ON 03/29/2018] sodium chloride    . sodium chloride    . [START ON 03/30/2018] heparin     PRN:[START ON 03/28/2018] sodium chloride, acetaminophen, albuterol, ondansetron (ZOFRAN) IV, oxyCODONE, [START ON 03/29/2018] sodium chloride flush  Results for orders placed or performed during the hospital encounter of 04/05/2018 (from the past 48 hour(s))  Basic metabolic panel     Status: Abnormal   Collection Time: 04/23/2018  2:18 PM  Result Value Ref Range   Sodium 133 (L) 135 - 145 mmol/L   Potassium 4.8 3.5 - 5.1 mmol/L    Comment: SLIGHT HEMOLYSIS   Chloride 105 98 - 111 mmol/L   CO2 17 (L) 22 - 32 mmol/L   Glucose, Bld 143 (H) 70 - 99 mg/dL   BUN 19 8 - 23 mg/dL   Creatinine, Ser 1.19 (H) 0.44 - 1.00 mg/dL   Calcium 9.2 8.9 - 10.3 mg/dL   GFR calc non Af Amer 40 (L) >60 mL/min   GFR calc Af Amer 46 (L) >60 mL/min   Anion gap 11 5 - 15    Comment: Performed at [facility], [address] [place], [place] [address]  I-Stat Troponin, ED (not at [facility])     Status: Abnormal   Collection Time: 04/18/2018  2:23 PM  Result Value  Ref Range   Troponin i, poc 0.20 (HH) 0.00 - 0.08 ng/mL   Comment NOTIFIED PHYSICIAN    Comment 3            Comment: Due to the release kinetics of cTnI, a negative result within the first hours of the onset of symptoms does not rule out myocardial infarction with certainty. If myocardial infarction is still suspected, repeat the test at appropriate intervals.  CBC with Differential     Status: Abnormal   Collection Time: 04/01/2018  2:52 PM  Result Value Ref Range   WBC 5.2 4.0 - 10.5 K/uL   RBC 4.26 3.87 - 5.11 MIL/uL   Hemoglobin 11.4 (L) 12.0 - 15.0 g/dL   HCT 36.1 36.0 - 46.0 %   MCV 84.7 80.0 - 100.0 fL   MCH 26.8 26.0 - 34.0 pg   MCHC 31.6 30.0 - 36.0 g/dL   RDW 14.3 11.5 - 15.5 %   Platelets 178 150 - 400 K/uL   nRBC 0.0 0.0 - 0.2 %   Neutrophils Relative % 77 %   Neutro Abs 4.0 1.7 - 7.7 K/uL   Lymphocytes Relative 14 %   Lymphs Abs 0.8 0.7 - 4.0 K/uL   Monocytes Relative 8 %   Monocytes Absolute 0.4 0.1 - 1.0 K/uL   Eosinophils Relative 0 %   Eosinophils Absolute 0.0 0.0 - 0.5 K/uL   Basophils Relative 0 %   Basophils Absolute 0.0 0.0 - 0.1 K/uL   Immature Granulocytes 1 %   Abs Immature Granulocytes 0.04 0.00 - 0.07 K/uL    Comment: Performed at [facility] [address] [place], [place] [address]  Troponin I - Once     Status: Abnormal   Collection Time: 04/04/2018  2:52 PM  Result Value Ref Range   Troponin I 0.17 (HH) <0.03 ng/mL    Comment: CRITICAL RESULT CALLED TO, READ BACK BY AND VERIFIED WITHJacinto Reap [facility] 1607 04/10/2018 D BRADLEY Performed at [facility], [place] [address], [place], [place] [address]   Troponin I - Now Then Q6H     Status: Abnormal   Collection Time: 04/04/2018  9:09 PM  Result Value Ref Range   Troponin I 0.34 (HH) <0.03 ng/mL    Comment: CRITICAL VALUE NOTED.  VALUE IS CONSISTENT WITH PREVIOUSLY REPORTED AND CALLED VALUE. Performed at [facility], [place] [address], [place], [place] [address]   Glucose, capillary      Status: Abnormal   Collection Time: 03/25/2018 10:02 PM  Result Value Ref Range   Glucose-Capillary 170 (H) 70 - 99 mg/dL  Heparin level (unfractionated)     Status: None   Collection Time: 04/20/2018 11:12 PM  Result Value Ref Range   Heparin Unfractionated 0.61 0.30 - 0.70 IU/mL    Comment: (NOTE) If heparin results are below expected values, and patient dosage has  been confirmed, suggest follow up testing of antithrombin III levels. Performed at [facility], [place] [address] [place], [place] [address]   Heparin level (unfractionated)     Status: None   Collection Time: 04/17/2018  3:34 AM  Result Value Ref Range   Heparin Unfractionated 0.59 0.30 - 0.70 IU/mL    Comment: (NOTE) If heparin results are below expected values, and patient dosage has  been confirmed, suggest follow up testing of antithrombin III levels. Performed at [facility], [place] [address], [place], [place] [address]   CBC     Status: Abnormal   Collection Time: 04/15/2018  3:34 AM  Result Value Ref Range   WBC 4.3 4.0 - 10.5 K/uL   RBC 4.26 3.87 - 5.11 MIL/uL   Hemoglobin 11.5 (L) 12.0 - 15.0 g/dL   HCT 34.8 (L) 36.0 - 46.0 %   MCV 81.7 80.0 - 100.0 fL   MCH 27.0 26.0 - 34.0 pg   MCHC 33.0 30.0 - 36.0 g/dL  RDW 14.5 11.5 - 15.5 %   Platelets 196 150 - 400 K/uL   nRBC 0.0 0.0 - 0.2 %    Comment: Performed at [facility], [place] [address] [place], [place] [address]  Troponin I - Now Then Q6H     Status: Abnormal   Collection Time: 04/02/2018  3:34 AM  Result Value Ref Range   Troponin I 0.62 (HH) <0.03 ng/mL    Comment: CRITICAL VALUE NOTED.  VALUE IS CONSISTENT WITH PREVIOUSLY REPORTED AND CALLED VALUE. Performed at [facility], [place] [address], [place], [place] [address]   Glucose, capillary     Status: Abnormal   Collection Time: 04/15/2018  8:59 AM  Result Value Ref Range   Glucose-Capillary 144 (H) 70 - 99 mg/dL  Troponin I - Now Then Q6H     Status: Abnormal   Collection  Time: 04/03/2018 11:05 AM  Result Value Ref Range   Troponin I 0.38 (HH) <0.03 ng/mL    Comment: CRITICAL VALUE NOTED.  VALUE IS CONSISTENT WITH PREVIOUSLY REPORTED AND CALLED VALUE. Performed at [facility], [place] [address], [place], [place] [address]   Glucose, capillary     Status: Abnormal   Collection Time: 04/10/2018 12:20 PM  Result Value Ref Range   Glucose-Capillary 120 (H) 70 - 99 mg/dL  Glucose, capillary     Status: Abnormal   Collection Time: 04/14/2018  4:59 PM  Result Value Ref Range   Glucose-Capillary 113 (H) 70 - 99 mg/dL    Dg Chest Portable 1 View  Result Date: 04/15/2018 CLINICAL DATA:  85 year old female with nonproductive cough for 6 days. Central chest pain. EXAM: PORTABLE CHEST 1 VIEW COMPARISON:  Chest radiographs 05/03/2017 and earlier. FINDINGS: Portable AP semi upright view at 1357 hours. Lung volumes and mediastinal contours remain normal. Visualized tracheal air column is within normal limits. Allowing for portable technique the lungs are clear. Paucity of bowel gas in the upper abdomen. No acute osseous abnormality identified. Stable cholecystectomy clips.  Calcified aortic atherosclerosis. IMPRESSION: No acute cardiopulmonary abnormality. Electronically Signed   By: Genevie Ann M.D.   On: 04/14/2018 14:18    Review of Systems  Constitutional: Positive for malaise/fatigue.  HENT: Negative.   Eyes: Negative.   Respiratory: Positive for shortness of breath.   Cardiovascular: Positive for chest pain.  Gastrointestinal: Positive for nausea.  Genitourinary: Negative.   Musculoskeletal: Negative.   Skin: Negative.   Neurological: Negative.   Endo/Heme/Allergies: Negative.   Psychiatric/Behavioral: Negative.    Blood pressure (!) 154/49, pulse 62, temperature 98.5 F (36.9 C), temperature source Oral, resp. rate 18, height 5\' 3"  (1.6 m), weight 70.1 kg, SpO2 93 %. Physical Exam  Constitutional: She is oriented to person, place, and time.  Elderly woman in  no distress  HENT:  Head: Normocephalic and atraumatic.  Mouth/Throat: Oropharynx is clear and moist.  Eyes: Pupils are equal, round, and reactive to light. Conjunctivae and EOM are normal.  Neck: No JVD present.  Cardiovascular: Normal rate, regular rhythm, normal heart sounds and intact distal pulses.  No murmur heard. Respiratory: Effort normal and breath sounds normal. No respiratory distress. She has no wheezes. She has no rales.  GI: Soft. Bowel sounds are normal. She exhibits no distension. There is no abdominal tenderness.  Musculoskeletal:        General: No edema.  Lymphadenopathy:    She has no cervical adenopathy.  Neurological: She is alert and oriented to person, place, and time.  Skin: Skin is warm and dry.  Psychiatric: She has a normal mood and affect.   Patient Name:   Tammie Rios Date of Exam: 04/22/2018 Medical Rec #:  [id number]      Height:       63.0 in Accession #:    [id number]     Weight:       154.5 lb Date of Birth:  12/29/1924       BSA:          1.73 m Patient Age:    49 years       BP:           162/92 mmHg Patient Gender: F              HR:           63 bpm. Exam Location:  Inpatient    Procedure: 2D Echo, Cardiac Doppler and Color Doppler  Indications:    R07.9* Chest pain, unspecified   History:        Patient has no prior history of Echocardiogram examinations.                 Risk Factors: Hypertension and Diabetes. Lung Cancer.   Sonographer:    Jonelle Sidle Dance Referring Phys: [id number] Raynaldo Opitz RAINES  IMPRESSIONS    1. The left ventricle has normal systolic function with an ejection fraction of 60-65%. The cavity size was normal. There is mildly increased left ventricular wall thickness. Left ventricular diastolic Doppler parameters are consistent with impaired  relaxation. Indeterminate filling pressures The E/e' is 8-15. No evidence of left ventricular regional wall motion abnormalities.  2. The right ventricle has normal systolic  function. The cavity was normal. There is no increase in right ventricular wall thickness.  3. The mitral valve is degenerative. Mild thickening of the mitral valve leaflet. Mild calcification of the mitral valve leaflet.  4. The aortic valve is tricuspid.  5. The aortic root and ascending aorta are normal in size and structure.  6. The inferior vena cava was normal in size with <50% respiratory variability.  SUMMARY   LVEF 60-65%, mild LVH, normal wall motion, grade 1 DD, indeterminate LV filling pressure, trivial MR, IVC suggests elevated RA pressure of 8 mmHg  FINDINGS  Left Ventricle: The left ventricle has normal systolic function, with an ejection fraction of 60-65%. The cavity size was normal. There is mildly increased left ventricular wall thickness. Left ventricular diastolic Doppler parameters are consistent  with impaired relaxation. Indeterminate filling pressures The E/e' is 8-15. No evidence of left ventricular regional wall motion abnormalities.. Right Ventricle: The right ventricle has normal systolic function. The cavity was normal. There is no increase in right ventricular wall thickness. Left Atrium: left atrial size was normal in size Right Atrium: right atrial size was normal in size. Right atrial pressure is estimated at 8 mmHg. Interatrial Septum: No atrial level shunt detected by color flow Doppler. Pericardium: There is no evidence of pericardial effusion. Mitral Valve: The mitral valve is degenerative in appearance. Mild thickening of the mitral valve leaflet. Mild calcification of the mitral valve leaflet. Mitral valve regurgitation is trivial by color flow Doppler. Tricuspid Valve: The tricuspid valve is normal in structure. Tricuspid valve regurgitation was not visualized by color flow Doppler. Aortic Valve: The aortic valve is tricuspid Aortic valve regurgitation was not visualized by color flow Doppler. There is no evidence of aortic valve stenosis. Pulmonic  Valve: The pulmonic valve was not well visualized.  Pulmonic valve regurgitation is not visualized by color flow Doppler. Aorta: The aortic root and ascending aorta are normal in size and structure. Venous: The inferior vena cava measures 1.98 cm, is normal in size with less than 50% respiratory variability.   LEFT VENTRICLE PLAX 2D LVIDd:         3.91 cm  Diastology LVIDs:         2.68 cm  LV e' lateral:   7.83 cm/s LV PW:         1.34 cm  LV E/e' lateral: 10.4 LV IVS:        1.29 cm  LV e' medial:    6.85 cm/s LVOT diam:     1.70 cm  LV E/e' medial:  11.9 LV SV:         40 ml LV SV Index:   22.31 LVOT Area:     2.27 cm  RIGHT VENTRICLE RV Basal diam:  2.24 cm RV S prime:     7.40 cm/s TAPSE (M-mode): 1.8 cm  LEFT ATRIUM             Index       RIGHT ATRIUM          Index LA diam:        4.50 cm 2.60 cm/m  RA Pressure: 8 mmHg LA Vol (A2C):   58.5 ml 33.76 ml/m RA Area:     9.09 cm LA Vol (A4C):   45.7 ml 26.37 ml/m RA Volume:   17.20 ml 9.93 ml/m LA Biplane Vol: 53.1 ml 30.65 ml/m  AORTIC VALVE LVOT Vmax:   79.80 cm/s LVOT Vmean:  53.900 cm/s LVOT VTI:    0.183 m   AORTA Ao Root diam: 3.00 cm Ao Asc diam:  3.30 cm  MITRAL VALVE MV Area (PHT): 4.80 cm    SHUNTS MV PHT:        45.82 msec  Systemic VTI:  0.18 m MV Decel Time: 158 msec    Systemic Diam: 1.70 cm MV E velocity: 81.50 cm/s MV A velocity: 108.00 cm/s MV E/A ratio:  0.75  IVC IVC diam: 1.98 cm    Lyman Bishop MD Electronically signed by Lyman Bishop MD Signature Date/Time: 04/17/2018/11:25:23 AM     Physicians   Panel Physicians Referring Physician Case Authorizing Physician  Belva Crome, MD (Primary)    Procedures   LEFT HEART CATH AND CORONARY ANGIOGRAPHY  Conclusion    Heavy three-vessel coronary calcification with downstream three-vessel coronary tortuosity.  Left main is patent.  LAD contains complex proximal to mid 99% stenosis followed by ectasia/aneurysm followed by  90% stenosis at bifurcation with the first diagonal.  Downstream LAD is tortuous and calcified.  Circumflex is widely patent and along with LAD supplies collaterals to the PDA.  Heavy calcification of the right coronary with total occlusion of the distal vessel.  Left ventricle is normal in size and function based upon echo performed this admission.  LVEDP 18 mmHg.  RECOMMENDATIONS:   Discussed with Dr. Sallyanne Kuster.  Decided to pause and consider treatment options.  PCI can be attempted but will be at higher than usual risk for ischemic complications.  The patient is probably not a surgical candidate due to age and prior lung surgery/cancer.  If active therapy chosen over medical therapy after discussion with the patient and family, high risk PCI could be attempted as early as tomorrow.  Resume IV heparin 8 hours after sheath removal  Recommendations   Antiplatelet/Anticoag Recommend  Aspirin 81mg  daily for moderate CAD.  Surgeon Notes     03/26/2018 3:54 PM CV Procedure addendum by Belva Crome, MD  Indications   Non-ST elevation MI (NSTEMI) ([place]) [I21.4 (ICD-10-CM)]  Procedural Details   Technical Details Right radial access achieved using ultrasound guidance.  Due to severe tortuosity in the subclavian/innominate/aortic bifurcations, catheter torque was not possible.  Because of this procedure was switched over to right femoral.   The right femoral was sterilely prepped and draped. 1% Xylocaine local infiltration was then given for local analgesia. Sedation was administered in the form of intravenous Versed and fentanyl.. Using real-time vascular ultrasound, a Seldinger needle was then used to perform an anterior wall common femoral artery stick. A VUS image was saved for the permanent record. A 5 [place] sheath was inserted using the modified Seldinger technique. Coronary angiography was then performed using a 5 [place] JL 3.5, and 5 French B2 MP. Left ventricular  hemodynamics and ventriculography was performed using a the multipurpose catheter and hand injection..   During this procedure the patient is administered a total of Versed 1 mg and Fentanyl 50 mg to achieve and maintain moderate conscious sedation.  The patient's heart rate, blood pressure, and oxygen saturation are monitored continuously during the procedure. The period of conscious sedation is 57 minutes, of which I was present face-to-face 100% of this time. Estimated blood loss <50 mL.   During this procedure medications were administered to achieve and maintain moderate conscious sedation while the patient's heart rate, blood pressure, and oxygen saturation were continuously monitored and I was present face-to-face 100% of this time.  Medications  (Filter: Administrations occurring from 03/27/2018 1348 to 04/14/2018 1550)  Medication Rate/Dose/Volume Action  Date Time   Heparin (Porcine) in NaCl 1000-0.9 UT/500ML-% SOLN (mL) 500 mL Given 04/03/2018 1405   Total dose as of 03/26/2018 1738 500 mL Given 1405   1,500 mL 500 mL Given 1406   midazolam (VERSED) injection (mg) 0.5 mg Given 03/27/2018 1434   Total dose as of 04/05/2018 1738 0.5 mg Given 1439   1 mg        fentaNYL (SUBLIMAZE) injection (mcg) 25 mcg Given 04/04/2018 1434   Total dose as of 04/21/2018 1738 25 mcg Given 1439   50 mcg        lidocaine (PF) (XYLOCAINE) 1 % injection (mL) 2 mL Given 03/30/2018 1438   Total dose as of 04/08/2018 1738 15 mL Given 1457   17 mL        Radial Cocktail/Verapamil only (mL)  Given 04/23/2018 1442   Total dose as of 04/20/2018 1738        Cannot be calculated        hydrALAZINE (APRESOLINE) injection (mg) 10 mg Given 03/26/2018 1533   Total dose as of 04/23/2018 1738        10 mg        iohexol (OMNIPAQUE) 350 MG/ML injection (mL) 60 mL Given 04/01/2018 1536   Total dose as of 04/14/2018 1738        60 mL        albuterol (PROVENTIL) (2.5 MG/3ML) 0.083% nebulizer solution 2.5 mg (mg) *Not included in total [facility] Hold  04/22/2018 1348   Dosing weight:  73.5 kg        Total dose as of 04/10/2018 1738        Cannot be calculated        cholecalciferol (VITAMIN D3) tablet 2,000 Units (Units) *Not included  in total [facility] Hold 04/09/2018 1348   Dosing weight:  73.5 kg        Total dose as of 04/10/2018 1738        Cannot be calculated        insulin aspart (novoLOG) injection 0-15 Units (Units) *Not included in total [facility] Hold 04/19/2018 1348   Dosing weight:  73.5 kg        Total dose as of 04/05/2018 1738        Cannot be calculated        latanoprost (XALATAN) 0.005 % ophthalmic solution 1 drop (drop) *Not included in total [facility] Hold 04/08/2018 1348   Dosing weight:  73.5 kg        Total dose as of 04/15/2018 1738        Cannot be calculated        mometasone-formoterol (DULERA) 100-5 MCG/ACT inhaler 2 puff (puff) *Not included in total [facility] Hold 04/15/2018 1348   Dosing weight:  73.5 kg        Total dose as of 04/17/2018 1738        Cannot be calculated        omega-3 acid ethyl esters (LOVAZA) capsule 1 g (g) *Not included in total [facility] Hold 04/06/2018 1348   Dosing weight:  73.5 kg        Total dose as of 03/30/2018 1738        Cannot be calculated        pantoprazole (PROTONIX) EC tablet 40 mg (mg) *Not included in total [facility] Hold 04/18/2018 1348   Dosing weight:  73.5 kg        Total dose as of 03/26/2018 1738        Cannot be calculated        valsartan (DIOVAN) 160 MG tablet 160 mg (mg) *Not included in total [facility] Hold 04/14/2018 1348   Dosing weight:  73.5 kg        Total dose as of 03/27/2018 1738        Cannot be calculated        vitamin C (ASCORBIC ACID) tablet 500 mg (mg) *Not included in total [facility] Hold 04/14/2018 1348   Dosing weight:  73.5 kg        Total dose as of 04/10/2018 1738        Cannot be calculated        acetaminophen (TYLENOL) tablet 650 mg (mg) *Not included in total [facility] Hold 04/24/2018 1348   Dosing weight:  73.5 kg        Total dose as of 04/20/2018 1738        Cannot be calculated        aspirin chewable  tablet 81 mg (mg) *Not included in total [facility] Hold 04/03/2018 1348   Dosing weight:  73.5 kg        Total dose as of 04/08/2018 1738        Cannot be calculated        ezetimibe-simvastatin (VYTORIN) 10-40 MG per tablet 1 tablet (tablet) *Not included in total [facility] Hold 04/22/2018 1348   Dosing weight:  73.5 kg        Total dose as of 03/26/2018 1738        Cannot be calculated        ondansetron (ZOFRAN) injection 4 mg (mg) *Not included in total MAR Hold 04/24/2018 1348   Dosing weight:  73.5 kg        Total  dose as of 04/15/2018 1738        Cannot be calculated        Sedation Time   Sedation Time Physician-1: 56 minutes 46 seconds  Coronary Findings   Diagnostic  Dominance: Co-dominant  Left Anterior Descending  Prox LAD lesion 40% stenosed  Prox LAD lesion is 40% stenosed.  Prox LAD-1 lesion 99% stenosed  Prox LAD-1 lesion is 99% stenosed.  Prox LAD-2 lesion 0% stenosed  Non-stenotic Prox LAD-2 lesion.  Prox LAD-3 lesion 95% stenosed  Prox LAD-3 lesion is 95% stenosed.  Left Circumflex  First Obtuse Marginal Branch  Vessel is large in size.  Second Obtuse Marginal Branch  Vessel is small in size.  Right Coronary Artery  Dist RCA lesion 100% stenosed  Dist RCA lesion is 100% stenosed.  Intervention   No interventions have been documented.  Wall Motion   Resting       All segments of the heart are normal.          Coronary Diagrams   Diagnostic  Dominance: Co-dominant        Assessment/Plan:  This 85 year old woman has severe two-vessel coronary disease with tandem 99 and 95% proximal to mid LAD stenoses with an aneurysmal segment in between as well as an occluded distal right coronary artery with faint left-to-right collaterals to the PDA.  Left ventricular systolic function is normal.  Her LAD is certainly suitable for coronary bypass but her operative risk would be increased due to her advanced age and I think she would have a very slow recovery.  Most patients in  her age group will need to go to a skilled nursing facility and may not make it back home to be functionally independent again.  I think that if PCI can be performed with a reasonable chance of success it would give her the best opportunity to return home with her daughter and remain functionally independent.  I discussed this with the patient and her daughter and they would like to talk to Dr. Tamala Julian again.  I told him that I thought coronary bypass surgery was an option for her if her lesions were not amenable to PCI and she was willing to accept the risk of surgery.   I spent 40 minutes performing this consultation and > 50% of this time was spent face to face counseling and coordinating the care of this patient's severe two-vessel coronary disease.   Gaye Pollack 04/06/2018, 5:25 PM

## 2018-04-12 NOTE — Plan of Care (Signed)

## 2018-04-12 NOTE — Progress Notes (Signed)
ANTICOAGULATION CONSULT NOTE - [place] for heparin Indication: chest pain/ACS  Allergies  Allergen Reactions  . Lisinopril Cough    Patient Measurements: Height: 5\' 3"  (160 cm) Weight: 154 lb 8 oz (70.1 kg) IBW/kg (Calculated) : 52.4 Heparin Dosing Weight: 68kg  Vital Signs: Temp: 98.5 F (36.9 C) (03/19 1225) Temp Source: Oral (03/19 1225) BP: 154/49 (03/19 1615) Pulse Rate: 62 (03/19 1615)  Labs: Recent Labs    04/08/2018 1418  04/07/2018 1452 03/26/2018 2109 04/20/2018 2312 04/23/2018 0334 04/01/2018 1105  HGB  --   --  11.4*  --   --  11.5*  --   HCT  --   --  36.1  --   --  34.8*  --   PLT  --   --  178  --   --  196  --   HEPARINUNFRC  --   --   --   --  0.61 0.59  --   CREATININE 1.19*  --   --   --   --   --   --   TROPONINI  --    < > 0.17* 0.34*  --  0.62* 0.38*   < > = values in this interval not displayed.    Estimated Creatinine Clearance: 28.9 mL/min (A) (by C-G formula based on SCr of 1.19 mg/dL (H)).   Medical History: Past Medical History:  Diagnosis Date  . Diabetes mellitus without complication ([place])   . Hypertension   . Lung cancer ([place])    Assessment: 40 YOF presenting with CP, initial troponin elevated, no anticoagulation PTA.  Heparin remains therapeutic, CBC stable.  S/p cath with multivessel CAD, cath stopped prior to intervention to review options. Possible PCI tomorrow, orders to resume heparin in 8 hours.   Goal of Therapy:  Heparin level 0.3-0.7 units/ml Monitor platelets by anticoagulation protocol: Yes   Plan:  -Restart heparin 700 units/hr -Daily heparin level and CBC  Erin Hearing PharmD., BCPS Clinical Pharmacist 04/04/2018 5:14 PM

## 2018-04-12 NOTE — Progress Notes (Addendum)
   Subjective: Tammie Rios reports that she as doing well, no acute events overnight. She reports that she has not had any chest pain since she has been here. She ate some food last night with no issues, denies any nausea, vomiting, or abdominal pain. Denies any new complaints. We discussed the results of her testing and that we will be having the cardiologist come and see her today. All questions were answered.   Objective:  Vital signs in last 24 hours: Vitals:   04/04/2018 1640 04/24/2018 2124 04/19/2018 2200 03/25/2018 0328  BP: (!) 166/56  (!) 104/92 (!) 162/92  Pulse: (!) 56  80 72  Resp:   20 16  Temp: 98.3 F (36.8 C)  99.1 F (37.3 C) 99.4 F (37.4 C)  TempSrc: Oral  Oral Oral  SpO2: 100% 97% 98% 100%  Weight: 73.5 kg     Height: 5\' 3"  (1.6 m)       General: Well appearing elderly female, NAD, laying comfortably in bed Cardiac: RRR, no m/r/g Pulmonary: CTABL, no wheezing or rhonchi Abdomen: Soft, non-tender, non-distended Extremity: No LE edema  Assessment/Plan:  Active Problems:   Chest pain  This is a 85 year old female with history of hypertension, diabetes, and lung cancer who presented with typical chest pain that was sharp in nature, substernal,  relieved with nitroglycerin. She has been feeling unwell for a few days, including nausea, headaches, decreased appetite, dry cough and decreased energy.    Chest pain: She had had some concerning features and heart score of 6. She remains chest pain free today, denies any nausea, vomiting.  Vitals have been stable except for some hypertension, normal cardiac and pulmonary exam.  Currently on heparin drip. She is still having elevated troponins. Findings are concerning for a possible ACS and may need a cardiac evaluation, other possible causes include GERD given her history of occasional heart burn or musculoskeletal causes.  -Troponin elevated 0.2 > 0.17 > 0.34 > 0.62 -Continue to trend troponins -F/u echocardiogram -Cardiology  had been consulted, appreciate recommendations -Continue heparin drip -Continue ASA 81 mg daily -Repeating EKG this AM -CBC and BMP in AM  Hypertension: -BP has started to increase, up to 160/90s.  -Continue diovan  Diabetes: -Holding home metformin, currently on SSI with frequent CBGs  Asthma: -Continue dulera.   FEN: No fluids, replete lytes prn, NPO until cardiology evaluates VTE ppx: Heparin drip Code Status: FULL    Dispo: Anticipated discharge is pending further evalaution.   Asencion Noble, MD 04/06/2018, 6:49 AM Pager: [phone]

## 2018-04-12 NOTE — H&P (View-Only) (Signed)
Cardiology Consultation:   Patient ID: Tammie Rios MRN: [id number]; DOB: 08-28-1924  Admit date: 03/29/2018 Date of Consult: 03/29/2018  Primary Care Provider: Rodena Medin, MD (Inactive) Primary Cardiologist: New to [facility]   Patient Profile:   Tammie Rios is a 85 y.o. female with a hx of hypertension, diabetes, and deficiency anemia and lung cancer who is being seen today for the evaluation of non-STEMI at the request of Dr. Rebeca Alert.  No prior cardiac history.  Remote routine stress test was normal.  History of Present Illness:   Tammie Rios woke up feeling fatigue yesterday morning.  Then before breakfast patient had substernal sharp chest pain.  Associated with severe shortness of breath and nausea.  No radiation or diaphoresis.  For the past week patient been feeling fatigued and tired.  Intermittent cough.  No fever, chills, orthopnea, PND, syncope, palpitation, dizziness or melena.  Symptoms persisted for 30 minutes and EMS was called.  She received aspirin 324 mg and sublingual nitroglycerin x2.  Her pain resolved in ER.  No recurrence.  Troponin 0.02.  Started on heparin and admitted.  Troponin trending up (0.17>>0.34>>0.62>>0.38).  Creatinine 1.19.  Potassium 4.8.  Hemoglobin 11.5.  EKG this morning showed anterior lateral T wave inversion which is more pronounced/new compared to yesterday-personally reviewed.  Echocardiogram showed normal LV function at 60 to 65%, intermittent filling pressure.  No wall motion abnormality.  At baseline, patient is somewhat active.  Lives with daughter.  Walks around the house, helps with cooking and goes for grocery shopping occasionally.   Past Medical History:  Diagnosis Date  . Diabetes mellitus without complication ([place])   . Hypertension   . Lung cancer [facility])     Past Surgical History:  Procedure Laterality Date  . CHOLECYSTECTOMY       Inpatient Medications: Scheduled Meds: . aspirin  81 mg Oral Daily  . cholecalciferol  2,000  Units Oral Daily  . ezetimibe-simvastatin  1 tablet Oral Daily  . hydrochlorothiazide  12.5 mg Oral Daily  . insulin aspart  0-15 Units Subcutaneous TID WC  . latanoprost  1 drop Both Eyes QHS  . mometasone-formoterol  2 puff Inhalation BID  . omega-3 acid ethyl esters  1 g Oral Daily  . pantoprazole  40 mg Oral Daily  . valsartan  160 mg Oral Daily  . vitamin C  500 mg Oral Daily   Continuous Infusions: . heparin 800 Units/hr (03/27/2018 1558)   PRN Meds: acetaminophen, albuterol, ondansetron (ZOFRAN) IV  Allergies:    Allergies  Allergen Reactions  . Lisinopril Cough    Social History:   Social History   Socioeconomic History  . Marital status: Widowed    Spouse name: Not on file  . Number of children: Not on file  . Years of education: Not on file  . Highest education level: Not on file  Occupational History  . Not on file  Social Needs  . Financial resource strain: Not on file  . Food insecurity:    Worry: Not on file    Inability: Not on file  . Transportation needs:    Medical: Not on file    Non-medical: Not on file  Tobacco Use  . Smoking status: Never Smoker  Substance and Sexual Activity  . Alcohol use: No  . Drug use: No  . Sexual activity: Not on file  Lifestyle  . Physical activity:    Days per week: Not on file    Minutes per session: Not on  file  . Stress: Not on file  Relationships  . Social connections:    Talks on phone: Not on file    Gets together: Not on file    Attends religious service: Not on file    Active member of club or organization: Not on file    Attends meetings of clubs or organizations: Not on file    Relationship status: Not on file  . Intimate partner violence:    Fear of current or ex partner: Not on file    Emotionally abused: Not on file    Physically abused: Not on file    Forced sexual activity: Not on file  Other Topics Concern  . Not on file  Social History Narrative  . Not on file    Family History:    Denies family history of CAD  ROS:  Please see the history of present illness.  All other ROS reviewed and negative.     Physical Exam/Data:   Vitals:   04/04/2018 2200 04/03/2018 0328 04/01/2018 1016 04/01/2018 1117  BP: (!) 104/92 (!) 162/92 (!) 155/71   Pulse: 80 72  71  Resp: 20 16  16   Temp: 99.1 F (37.3 C) 99.4 F (37.4 C)    TempSrc: Oral Oral    SpO2: 98% 100%  98%  Weight:  70.1 kg    Height:        Intake/Output Summary (Last 24 hours) at 03/30/2018 1211 Last data filed at 04/17/2018 0300 Gross per 24 hour  Intake 127.8 ml  Output -  Net 127.8 ml   Last 3 Weights 04/21/2018 04/23/2018 04/15/2018  Weight (lbs) 154 lb 8 oz 162 lb 0.6 oz 162 lb  Weight (kg) 70.081 kg 73.5 kg 73.483 kg     Body mass index is 27.37 kg/m.  General:  Well nourished, well developed, in no acute distress HEENT: normal Lymph: no adenopathy Neck: no JVD Endocrine:  No thryomegaly Vascular: No carotid bruits; FA pulses 2+ bilaterally without bruits  Cardiac:  normal S1, S2; RRR; no murmur  Lungs:  clear to auscultation bilaterally, no wheezing, rhonchi or rales  Abd: soft, nontender, no hepatomegaly  Ext: no edema Musculoskeletal:  No deformities, BUE and BLE strength normal and equal Skin: warm and dry  Neuro:  CNs 2-12 intact, no focal abnormalities noted Psych:  Normal affect   Telemetry:  Telemetry was personally reviewed and demonstrates: Normal sinus rhythm  Relevant CV Studies:  Echo 04/17/2018   1. The left ventricle has normal systolic function with an ejection fraction of 60-65%. The cavity size was normal. There is mildly increased left ventricular wall thickness. Left ventricular diastolic Doppler parameters are consistent with impaired  relaxation. Indeterminate filling pressures The E/e' is 8-15. No evidence of left ventricular regional wall motion abnormalities.  2. The right ventricle has normal systolic function. The cavity was normal. There is no increase in right  ventricular wall thickness.  3. The mitral valve is degenerative. Mild thickening of the mitral valve leaflet. Mild calcification of the mitral valve leaflet.  4. The aortic valve is tricuspid.  5. The aortic root and ascending aorta are normal in size and structure.  6. The inferior vena cava was normal in size with <50% respiratory variability.   Laboratory Data:  Chemistry Recent Labs  Lab 04/15/2018 1418  NA 133*  K 4.8  CL 105  CO2 17*  GLUCOSE 143*  BUN 19  CREATININE 1.19*  CALCIUM 9.2  GFRNONAA 40*  GFRAA 46*  ANIONGAP 11     Hematology Recent Labs  Lab 04/01/2018 1452 04/23/2018 0334  WBC 5.2 4.3  RBC 4.26 4.26  HGB 11.4* 11.5*  HCT 36.1 34.8*  MCV 84.7 81.7  MCH 26.8 27.0  MCHC 31.6 33.0  RDW 14.3 14.5  PLT 178 196   Cardiac Enzymes Recent Labs  Lab 04/01/2018 1452 04/10/2018 2109 04/08/2018 0334 04/19/2018 1105  TROPONINI 0.17* 0.34* 0.62* 0.38*    Recent Labs  Lab 03/25/2018 1423  TROPIPOC 0.20*    Radiology/Studies:  Dg Chest Portable 1 View  Result Date: 04/20/2018 CLINICAL DATA:  85 year old female with nonproductive cough for 6 days. Central chest pain. EXAM: PORTABLE CHEST 1 VIEW COMPARISON:  Chest radiographs 05/03/2017 and earlier. FINDINGS: Portable AP semi upright view at 1357 hours. Lung volumes and mediastinal contours remain normal. Visualized tracheal air column is within normal limits. Allowing for portable technique the lungs are clear. Paucity of bowel gas in the upper abdomen. No acute osseous abnormality identified. Stable cholecystectomy clips.  Calcified aortic atherosclerosis. IMPRESSION: No acute cardiopulmonary abnormality. Electronically Signed   By: Genevie Ann M.D.   On: 04/06/2018 14:18    Assessment and Plan:   1. NSTEMI - Peak of troponin 0.62, now trending down.  Chest pain-free since got sublingual nitroglycerin x2 yesterday.  On IV heparin. Echo showed normal LVEF. No WM abnormality. EKG with new anterior lateral TWI.   -  Patient is 85 yr old, lives with daughter and somewhat functional.  - Will review with MD for best treatment option, medical versus cardiac cath. - Continue ASA, statin and heparin.   2. HTN - BP elevated at 150-160s. Continue home Diovan-HCT. Will avoid BB or any AV blocking agent given prolonged PR interval.   3. DM - Per primary   Keep NPO. Dr. Sallyanne Kuster to see.   For questions or updates, please contact [place] Please consult [email or web address] for contact info under   Jarrett Soho, PA  04/08/2018 12:11 PM   I have seen and examined the patient along with Leanor Kail, PA   I have reviewed the chart, notes and new data.  I agree with PA/NP's note.  Key new complaints: Presentation is highly consistent with unstable angina/small acute  myocardial infarction (non-ST segment elevation).  Severe chest pain lasted for about 35-40 minutes, improved after sublingual nitroglycerin.  Currently asymptomatic.  Despite her advanced age she is quite functional and has good quality of life. Key examination changes: Normal cardiovascular exam. Key new findings / data: ECG suggests extensive anterior and anterolateral ischemia, suspicion for proximal LAD artery stenosis, stable multivessel CAD.  Borderline renal function parameters.  Has had numerous previous contrast based imaging studies for follow-up of her lung cancer.  PLAN: Recommend urgent coronary angiography and revascularization as appropriate.  The threshold for coronary bypass surgery would be set very high due to her advanced age and previous chest surgery for lung cancer.  Even if she has multivessel CAD, it may be best to treat with percutaneous revascularization of the culprit lesion followed by medical therapy for any other lesions. Her diabetes is well controlled. Discussed both the diagnostic coronary angiography and possible percutaneous revascularization with angioplasty and stent in detail with the patient  and her 2 daughters. This procedure has been fully reviewed with the patient and written informed consent has been obtained. Continue intravenous heparin until ready for procedure.  has received aspirin. Very long first-degree AV block (PR 380 ms).  We will not administer beta-blockers.  High-dose statin.  Sanda Klein, MD, [place] [phone] 04/19/2018, 1:09 PM

## 2018-04-12 NOTE — Progress Notes (Signed)
  Date: 04/10/2018  Patient name: Tammie Rios  Medical record number: [id number]  Date of birth: August 15, 1924   I have seen and evaluated this patient and I have discussed the plan of care with the house staff. Please see their note for complete details. I concur with their findings with the following additions/corrections:   Please see my separate attestation of the H&P from 04/01/2018.  She continues to be pain-free this morning and reports no further nausea.  [place] cardiology consultation, they are recommending catheterization and PCI of possible.  Lenice Pressman, M.D., Ph.D. 04/10/2018, 1:30 PM

## 2018-04-12 NOTE — Progress Notes (Signed)
Site area: rt groin fa sheath Site Prior to Removal:  Level 0 Pressure Applied For: 20 minutes Manual:   yes Patient Status During Pull:  stable Post Pull Site:  Level 0 Post Pull Instructions Given:  yes Post Pull Pulses Present: rt dp palpable Dressing Applied:  Gauze andtegaderm Bedrest begins @ [id number] Comments:

## 2018-04-12 NOTE — Progress Notes (Signed)
Paged for recurrent chest pain. Patients pain did not respond to first sublingual NTG. After second, her BP dropped to 70 systolic, she developed nausea and headache. EKG did not demonstrate ST/nonSTEMI changes. Chest pain resolved and BP improved while at bedside. Cardiology AP Barrett recommended NTG GTT low dose after 1-2 hours washout for chest pain. Vitals stable  Vitals:   04/06/2018 1848 04/22/2018 1857  BP: (!) 90/47 (!) 109/55  Pulse: 66 72  Resp:    Temp:    SpO2: 94% 97%   Tylenol for HA NTG GTT low dose for CP Transfer to Progressive care  Pain now resolved, will monitor closely   Kathi Ludwig, MD [facility] Internal Medicine, PGY-2

## 2018-04-12 NOTE — Progress Notes (Signed)
[place] for heparin Indication: chest pain/ACS  Allergies  Allergen Reactions  . Lisinopril Cough    Patient Measurements: Height: 5\' 3"  (160 cm) Weight: 162 lb 0.6 oz (73.5 kg) IBW/kg (Calculated) : 52.4 Heparin Dosing Weight: 68kg  Vital Signs: Temp: 99.1 F (37.3 C) (03/18 2200) Temp Source: Oral (03/18 2200) BP: 104/92 (03/18 2200) Pulse Rate: 80 (03/18 2200)  Labs: Recent Labs    04/04/2018 1418 03/26/2018 1452 04/14/2018 2109 04/08/2018 2312  HGB  --  11.4*  --   --   HCT  --  36.1  --   --   PLT  --  178  --   --   HEPARINUNFRC  --   --   --  0.61  CREATININE 1.19*  --   --   --   TROPONINI  --  0.17* 0.34*  --     Estimated Creatinine Clearance: 29.6 mL/min (A) (by C-G formula based on SCr of 1.19 mg/dL (H)).   Assessment: 85 y.o. female with chest pain for heparin  Goal of Therapy:  Heparin level 0.3-0.7 units/ml Monitor platelets by anticoagulation protocol: Yes   Plan:  Continue Heparin at current rate Follow-up am labs.   Phillis Knack, PharmD, BCPS  04/23/2018 12:05 AM

## 2018-04-13 ENCOUNTER — Encounter (HOSPITAL_COMMUNITY): Admission: EM | Disposition: E | Payer: Self-pay | Source: Home / Self Care | Attending: Internal Medicine

## 2018-04-13 ENCOUNTER — Encounter (HOSPITAL_COMMUNITY): Payer: Self-pay | Admitting: Interventional Cardiology

## 2018-04-13 DIAGNOSIS — R7989 Other specified abnormal findings of blood chemistry: Secondary | ICD-10-CM

## 2018-04-13 DIAGNOSIS — R778 Other specified abnormalities of plasma proteins: Secondary | ICD-10-CM | POA: Diagnosis present

## 2018-04-13 DIAGNOSIS — I2510 Atherosclerotic heart disease of native coronary artery without angina pectoris: Secondary | ICD-10-CM

## 2018-04-13 HISTORY — PX: CORONARY ANGIOGRAPHY: CATH118303

## 2018-04-13 LAB — CBC
HCT: 33.8 % — ABNORMAL LOW (ref 36.0–46.0)
Hemoglobin: 11.2 g/dL — ABNORMAL LOW (ref 12.0–15.0)
MCH: 27.5 pg (ref 26.0–34.0)
MCHC: 33.1 g/dL (ref 30.0–36.0)
MCV: 82.8 fL (ref 80.0–100.0)
Platelets: 185 10*3/uL (ref 150–400)
RBC: 4.08 MIL/uL (ref 3.87–5.11)
RDW: 14.6 % (ref 11.5–15.5)
WBC: 3.9 10*3/uL — ABNORMAL LOW (ref 4.0–10.5)
nRBC: 0 % (ref 0.0–0.2)

## 2018-04-13 LAB — BASIC METABOLIC PANEL
Anion gap: 14 (ref 5–15)
Anion gap: 8 (ref 5–15)
BUN: 14 mg/dL (ref 8–23)
BUN: 16 mg/dL (ref 8–23)
CO2: 16 mmol/L — ABNORMAL LOW (ref 22–32)
CO2: 18 mmol/L — AB (ref 22–32)
Calcium: 8.9 mg/dL (ref 8.9–10.3)
Calcium: 8.7 mg/dL — ABNORMAL LOW (ref 8.9–10.3)
Chloride: 107 mmol/L (ref 98–111)
Chloride: 108 mmol/L (ref 98–111)
Creatinine, Ser: 1.13 mg/dL — ABNORMAL HIGH (ref 0.44–1.00)
Creatinine, Ser: 1.04 mg/dL — ABNORMAL HIGH (ref 0.44–1.00)
GFR calc Af Amer: 49 mL/min — ABNORMAL LOW (ref >60)
GFR calc Af Amer: 54 mL/min — ABNORMAL LOW (ref >60)
GFR calc non Af Amer: 42 mL/min — ABNORMAL LOW (ref >60)
GFR calc non Af Amer: 47 mL/min — ABNORMAL LOW (ref >60)
Glucose, Bld: 119 mg/dL — ABNORMAL HIGH (ref 70–99)
Glucose, Bld: 140 mg/dL — ABNORMAL HIGH (ref 70–99)
POTASSIUM: 3.7 mmol/L (ref 3.5–5.1)
Potassium: 3.6 mmol/L (ref 3.5–5.1)
Sodium: 137 mmol/L (ref 135–145)
Sodium: 134 mmol/L — ABNORMAL LOW (ref 135–145)

## 2018-04-13 LAB — GLUCOSE, CAPILLARY
Glucose-Capillary: 104 mg/dL — ABNORMAL HIGH (ref 70–99)
Glucose-Capillary: 124 mg/dL — ABNORMAL HIGH (ref 70–99)
Glucose-Capillary: 146 mg/dL — ABNORMAL HIGH (ref 70–99)
Glucose-Capillary: 104 mg/dL — ABNORMAL HIGH (ref 70–99)

## 2018-04-13 LAB — LIPID PANEL
Cholesterol: 94 mg/dL (ref 0–200)
HDL: 25 mg/dL — ABNORMAL LOW (ref >40)
LDL Cholesterol: 49 mg/dL (ref 0–99)
Total CHOL/HDL Ratio: 3.8 RATIO
Triglycerides: 101 mg/dL (ref <150)
VLDL: 20 mg/dL (ref 0–40)

## 2018-04-13 LAB — HEPARIN LEVEL (UNFRACTIONATED): Heparin Unfractionated: 0.34 IU/mL (ref 0.30–0.70)

## 2018-04-13 LAB — POCT ACTIVATED CLOTTING TIME: Activated Clotting Time: 384 seconds

## 2018-04-13 SURGERY — CORONARY ANGIOGRAPHY (CATH LAB)
Anesthesia: LOCAL

## 2018-04-13 MED ORDER — LIDOCAINE HCL (PF) 1 % IJ SOLN
INTRAMUSCULAR | Status: AC
  Filled 2018-04-13: qty 30

## 2018-04-13 MED ORDER — SODIUM CHLORIDE 0.9 % IV SOLN: INTRAVENOUS | Status: AC

## 2018-04-13 MED ORDER — SODIUM CHLORIDE 0.9 % IV SOLN: 250.0000 mL | INTRAVENOUS | Status: DC | PRN

## 2018-04-13 MED ORDER — LIDOCAINE HCL (PF) 1 % IJ SOLN
INTRAMUSCULAR | Status: DC | PRN
  Administered 2018-04-13: 15 mL via INTRADERMAL

## 2018-04-13 MED ORDER — FENTANYL CITRATE (PF) 100 MCG/2ML IJ SOLN
INTRAMUSCULAR | Status: DC | PRN
  Administered 2018-04-13 (×2): 25 ug via INTRAVENOUS

## 2018-04-13 MED ORDER — SODIUM CHLORIDE 0.9 % WEIGHT BASED INFUSION: 3.0000 mL/kg/h | INTRAVENOUS | Status: DC

## 2018-04-13 MED ORDER — SODIUM CHLORIDE 0.9 % WEIGHT BASED INFUSION
3.0000 mL/kg/h | INTRAVENOUS | Status: DC
  Administered 2018-04-13: 3 mL/kg/h via INTRAVENOUS

## 2018-04-13 MED ORDER — HEPARIN (PORCINE) IN NACL 1000-0.9 UT/500ML-% IV SOLN
INTRAVENOUS | Status: AC
  Filled 2018-04-13: qty 1000

## 2018-04-13 MED ORDER — SODIUM CHLORIDE 0.9 % WEIGHT BASED INFUSION: 1.0000 mL/kg/h | INTRAVENOUS | Status: DC

## 2018-04-13 MED ORDER — HEPARIN (PORCINE) 25000 UT/250ML-% IV SOLN: 700.0000 [IU]/h | INTRAVENOUS | Status: DC

## 2018-04-13 MED ORDER — LABETALOL HCL 5 MG/ML IV SOLN: 10.0000 mg | INTRAVENOUS | Status: AC | PRN

## 2018-04-13 MED ORDER — MIDAZOLAM HCL 2 MG/2ML IJ SOLN
INTRAMUSCULAR | Status: AC
  Filled 2018-04-13: qty 2

## 2018-04-13 MED ORDER — MIDAZOLAM HCL 2 MG/2ML IJ SOLN
INTRAMUSCULAR | Status: DC | PRN
  Administered 2018-04-13: 0.5 mg via INTRAVENOUS

## 2018-04-13 MED ORDER — ACETAMINOPHEN 325 MG PO TABS
650.0000 mg | ORAL_TABLET | ORAL | Status: DC | PRN
  Administered 2018-04-13 – 2018-04-15 (×3): 650 mg via ORAL
  Filled 2018-04-13 (×3): qty 2

## 2018-04-13 MED ORDER — ASPIRIN 81 MG PO CHEW: 81.0000 mg | CHEWABLE_TABLET | ORAL | Status: DC

## 2018-04-13 MED ORDER — HEPARIN (PORCINE) 25000 UT/250ML-% IV SOLN
700.0000 [IU]/h | INTRAVENOUS | Status: DC
  Administered 2018-04-14: 700 [IU]/h via INTRAVENOUS
  Filled 2018-04-13: qty 250

## 2018-04-13 MED ORDER — SODIUM CHLORIDE 0.9% FLUSH: 3.0000 mL | Freq: Two times a day (BID) | INTRAVENOUS | Status: DC

## 2018-04-13 MED ORDER — HEPARIN (PORCINE) IN NACL 1000-0.9 UT/500ML-% IV SOLN
INTRAVENOUS | Status: DC | PRN
  Administered 2018-04-13 (×2): 500 mL

## 2018-04-13 MED ORDER — ORAL CARE MOUTH RINSE
15.0000 mL | Freq: Two times a day (BID) | OROMUCOSAL | Status: DC
  Administered 2018-04-13 – 2018-04-15 (×4): 15 mL via OROMUCOSAL

## 2018-04-13 MED ORDER — BIVALIRUDIN BOLUS VIA INFUSION - CUPID
INTRAVENOUS | Status: DC | PRN
  Administered 2018-04-13: 53.4 mg via INTRAVENOUS

## 2018-04-13 MED ORDER — SODIUM CHLORIDE 0.9% FLUSH: 3.0000 mL | INTRAVENOUS | Status: DC | PRN

## 2018-04-13 MED ORDER — SODIUM CHLORIDE 0.9 % IV SOLN
INTRAVENOUS | Status: DC | PRN
  Administered 2018-04-13: 1.75 mg/kg/h via INTRAVENOUS

## 2018-04-13 MED ORDER — NITROGLYCERIN 1 MG/10 ML FOR IR/CATH LAB
INTRA_ARTERIAL | Status: AC
  Filled 2018-04-13: qty 10

## 2018-04-13 MED ORDER — HYDRALAZINE HCL 20 MG/ML IJ SOLN: 5.0000 mg | INTRAMUSCULAR | Status: AC | PRN

## 2018-04-13 MED ORDER — SODIUM CHLORIDE 0.9 % IV SOLN
4.0000 ug/kg/min | INTRAVENOUS | Status: DC
  Administered 2018-04-13: 4 ug/kg/min via INTRAVENOUS
  Filled 2018-04-13: qty 50

## 2018-04-13 MED ORDER — SODIUM CHLORIDE 0.9 % IV SOLN
4.0000 ug/kg/min | INTRAVENOUS | Status: DC
  Filled 2018-04-13: qty 50

## 2018-04-13 MED ORDER — FENTANYL CITRATE (PF) 100 MCG/2ML IJ SOLN
INTRAMUSCULAR | Status: AC
  Filled 2018-04-13: qty 2

## 2018-04-13 MED ORDER — SODIUM CHLORIDE 0.9% FLUSH
3.0000 mL | Freq: Two times a day (BID) | INTRAVENOUS | Status: DC
  Administered 2018-04-13 – 2018-04-16 (×4): 3 mL via INTRAVENOUS

## 2018-04-13 MED ORDER — MORPHINE SULFATE (PF) 2 MG/ML IV SOLN
1.0000 mg | INTRAVENOUS | Status: DC | PRN
  Administered 2018-04-13: 2 mg via INTRAVENOUS
  Filled 2018-04-13: qty 1

## 2018-04-13 MED ORDER — CANGRELOR BOLUS VIA INFUSION
30.0000 ug/kg | Freq: Once | INTRAVENOUS | Status: DC
  Filled 2018-04-13: qty 2136

## 2018-04-13 MED ORDER — ONDANSETRON HCL 4 MG/2ML IJ SOLN
4.0000 mg | Freq: Four times a day (QID) | INTRAMUSCULAR | Status: DC | PRN
  Administered 2018-04-14 – 2018-04-16 (×2): 4 mg via INTRAVENOUS
  Filled 2018-04-13 (×2): qty 2

## 2018-04-13 MED ORDER — CANGRELOR BOLUS VIA INFUSION
30.0000 ug/kg | Freq: Once | INTRAVENOUS | Status: AC
  Administered 2018-04-13: 2136 ug via INTRAVENOUS
  Filled 2018-04-13: qty 2136

## 2018-04-13 MED ORDER — BIVALIRUDIN TRIFLUOROACETATE 250 MG IV SOLR
INTRAVENOUS | Status: AC
  Filled 2018-04-13: qty 250

## 2018-04-13 MED ORDER — IOHEXOL 350 MG/ML SOLN
INTRAVENOUS | Status: DC | PRN
  Administered 2018-04-13: 155 mL via INTRA_ARTERIAL

## 2018-04-13 MED ORDER — ASPIRIN EC 81 MG PO TBEC
81.0000 mg | DELAYED_RELEASE_TABLET | Freq: Every day | ORAL | Status: DC
  Administered 2018-04-14 – 2018-04-16 (×3): 81 mg via ORAL
  Filled 2018-04-13 (×3): qty 1

## 2018-04-13 SURGICAL SUPPLY — 17 items
CATH LAUNCHER 6FR EBU3.5 (CATHETERS) ×2 IMPLANT
CATH TELEPORT (CATHETERS) ×2 IMPLANT
CATH VISTA GUIDE 6FR XB3.5 (CATHETERS) ×2 IMPLANT
CATH VISTA GUIDE 6FR XBLAD3.0 (CATHETERS) ×2 IMPLANT
CATH VISTA GUIDE 6FR XBLAD3.5 (CATHETERS) ×2 IMPLANT
ELECT DEFIB PAD ADLT CADENCE (PAD) ×2 IMPLANT
KIT ENCORE 26 ADVANTAGE (KITS) ×2 IMPLANT
KIT HEART LEFT (KITS) ×2 IMPLANT
LUBRICANT VIPERSLIDE CORONARY (MISCELLANEOUS) ×2 IMPLANT
PACK CARDIAC CATHETERIZATION (CUSTOM PROCEDURE TRAY) ×2 IMPLANT
SHEATH PINNACLE 6F 10CM (SHEATH) ×2 IMPLANT
SHEATH PINNACLE 7F 10CM (SHEATH) ×2 IMPLANT
SHEATH PROBE COVER 6X72 (BAG) ×2 IMPLANT
TRANSDUCER W/STOPCOCK (MISCELLANEOUS) ×2 IMPLANT
TUBING CIL FLEX 10 FLL-RA (TUBING) ×2 IMPLANT
WIRE ASAHI FIELDER XT 300CM (WIRE) ×2 IMPLANT
WIRE EMERALD 3MM-J .035X150CM (WIRE) ×2 IMPLANT

## 2018-04-13 NOTE — Progress Notes (Signed)
   Subjective: Tammie Rios reports that he was doing well today, overnight she had an episode of chest pain.  She does not remember having this episode however the daughter was present reports that the patient was confused when it happened.  The daughter reports that she was having some chest pain, became hypotensive and was given medications to resolve the pain. Tammie Rios reports that she is doing well right now, denies any chest pain, shortness of breath, or other symptoms bothering her. The daughter and patient had an extensive discussion with the cardiologist about the possible options for her NSTEMI, the family appears to be leaning more towards PCI at this time. All questions were answered.   Objective:  Vital signs in last 24 hours: Vitals:   04/17/2018 1857 03/30/2018 1929 03/27/2018 2348 04/19/2018 0541  BP: (!) 109/55 (!) 130/56 (!) 122/58 (!) 145/62  Pulse: 72 65 75 64  Resp:  20    Temp:  98.4 F (36.9 C) 99.1 F (37.3 C) 98.2 F (36.8 C)  TempSrc:  Oral Axillary Oral  SpO2: 97% 99% 94% 97%  Weight:      Height:        General: Elderly female, NAD, laying comfortably in bed Cardiac: RRR, no m/r/g Pulmonary: CTABL, no wheezing or rhonchi Abdomen: Soft, non-tender, normoactive bowel sounds Extremity: No LE edema  Assessment/Plan:  Principal Problem:   NSTEMI (non-ST elevated myocardial infarction) (HCC) Active Problems:   Chest pain   Essential hypertension   Diabetes mellitus ([place])  This is a 85 year old female with history of hypertension, diabetes, and lung cancer who presented with typical chest painthat was sharp in nature,substernal, relievedwith nitroglycerin. She has been feeling unwell for a few days, including nausea,headaches, decreased appetite, dry cough and decreased energy.    NSTEMI: Patient presented with chest pain, found to have elevated troponins that peaked at 0.62, EKG showed inverted T waves in the lateral leads.  She was started on heparin drip  and taken for cardiac catheterization yesterday.  Cardiac catheter showed severe two-vessel coronary artery disease with total occlusion of the distal RCA and severe calcified and in proximal to mid LAD stenosis with a region of superimposed ectasia/fusiform aneurysm.  No interventions were done at that time to allow for further discussion with the family given that high-risk procedure.  Vascular surgery was consulted, they reported that a high risk PCI could be attempted if active therapy is chosen however it would be high risk. She continues to have chest pain, she had an episode overnight and per cardiology note she had another episode this morning.  Per cardiology's note this morning they have decided to try PCI. -Cardiology following, appreciate recommendations --Started cangrelor for possible emergent surgery  --Plan to return to cath soon -Vascular following, appreciate recommendations -Continue heparin drip -Continue ASA 81 mg daily -Continue NTG gtt -Morphine PRN -Nitro-stat PRN -CBC and BMP in AM  Hypertension: -BP have been relatively stable, most recent value of 126/58.  -Continue diovan -Continue to monitor  Diabetes: -Holding home metformin, currently on SSI with frequent CBGs -Continue SSI-moderate -Continue frequent CBGs  Asthma: -Continue dulera -Albuterol PRN  FEN: No fluids, replete lytes prn, NPO until cardiology evaluates VTE ppx: Heparin drip Code Status: FULL     Dispo: Anticipated discharge in approximately 2-3 day(s).   Tammie Noble, MD 04/14/2018, 6:38 AM Pager: [phone]

## 2018-04-13 NOTE — Progress Notes (Addendum)
  Date: 04/24/2018  Patient name: Tammie Rios  Medical record number: [id number]  Date of birth: [birth year]   I have seen and evaluated this patient and I have discussed the plan of care with the house staff. Please see their note for complete details. I concur with their findings with the following additions/corrections:   Unfortunately, she had recurrent chest pain overnight, treated initially with sublingual nitroglycerin, but she developed hypotension and a headache, and was transitioned to a nitroglycerin drip.  Appreciate thorough evaluation and assistance with decision making provided by cardiothoracic surgery and cardiology.  This is obviously a difficult situation, and our team also spent a good deal of time reviewing the options with [name]'s daughter, [name].  She seems to have a clear understanding of the options of medical management, PCI, or CABG.  The family seems to be in agreement that CABG presents too much of a risk for her.  It appears the family have agreed to proceed with PCI, planned for later today.  Greater than 50% of this 35-minute visit was spent discussing the plan of care with the patient and her family and coordinating care with other services.  Lenice Pressman, M.D., Ph.D. 03/30/2018, 11:24 AM

## 2018-04-13 NOTE — CV Procedure (Signed)
   Coronary angiography and attempted PCI of LAD via right femoral using real-time vascular ultrasound for access.  Multiple guiding catheters were used to obtain guide position, ultimately using a 3.5 x 6 [place] EBU.  Guide position was tenuous.  Guide catheter with select circumflex when attempts at wiring were made.  Readjustment would lead to loss of guide position.  Eventually able to steer into LAD and again loss guide support with recall of guidewire and catheter into the aorta.  Patient became restless and somewhat agitated.  After consideration of complexity of treatment target and now difficulty with simply achieving adequate guide catheter support to allow intervention, the procedure was aborted.  We will speak with family.  We will also readdress with Dr. Cyndia Bent.  Perhaps surgery could still be an option if patient would re-consider.  Otherwise medical therapy will be employed, and outcome will likely not be favorabl

## 2018-04-13 NOTE — Progress Notes (Addendum)
INTERVENTIONAL CARDIOLOGY   She has had recurring bouts of chest pressure despite IV nitroglycerin throughout the night.  Currently having mild chest discomfort.  2 daughters (1 by phone), patient, and I had an extensive conversation concerning treatment options.  Medical therapy has failed.  She has been accepted as a surgical candidate by Dr. Cyndia Bent and will be at increased for postop recovery and have difficulty with convalescence at her age.  The patient and family also speaks of a family member who is 83 years of age and still having difficulty recovering from open heart surgery at age 89.  We discussed PCI, the possibility of acute closure, the possibility that stent placement may not be possible, the possibility of acute injury leading to hemodynamic instability which could lead to death in the lab, and generalized heightened risk of a poor outcome to age and complicated anatomy..  The heightened risk was estimated to be 5-10 times higher, therefore acute ischemic risk estimated at 10-15 %.  Probably not an emergency surgical candidate because all rooms are full currently.  After consideration the patient has decided against elective surgery in favor of PCI.  The family concurs.  We will start IV cangrelor in case we find ourselves desiring urgent surgery.  IV morphine has been ordered for pain control.  Cath Lab soon with plans for orbital atherectomy and stenting if technically possible.   Plan right femoral approach.  We will perform EKG now since patient is having active chest pain.  Time spent, 45 minutes.

## 2018-04-13 NOTE — Progress Notes (Signed)
ANTICOAGULATION CONSULT NOTE - [place] for heparin Indication: chest pain/ACS  Allergies  Allergen Reactions  . Lisinopril Cough    Patient Measurements: Height: 5\' 3"  (160 cm) Weight: 156 lb 14.4 oz (71.2 kg) IBW/kg (Calculated) : 52.4 Heparin Dosing Weight: 68kg  Vital Signs: Temp: 98.4 F (36.9 C) (03/20 0832) Temp Source: Oral (03/20 0832) BP: 131/64 (03/20 1410) Pulse Rate: 89 (03/20 1410)  Labs: Recent Labs    04/02/2018 1418  04/17/2018 1452  04/08/2018 2312 04/24/2018 0334 03/25/2018 1105 04/23/2018 1647 04/07/2018 2011 04/18/2018 0353 04/18/2018 0831 03/31/2018 1050  HGB  --    < > 11.4*  --   --  11.5*  --   --   --  11.2*  --   --   HCT  --   --  36.1  --   --  34.8*  --   --   --  33.8*  --   --   PLT  --   --  178  --   --  196  --   --   --  185  --   --   HEPARINUNFRC  --   --   --   --  0.61 0.59  --   --   --   --  0.34  --   CREATININE 1.19*  --   --   --   --   --   --   --   --  1.13*  --  1.04*  TROPONINI  --   --  0.17*   < >  --  0.62* 0.38* 0.25* 0.18*  --   --   --    < > = values in this interval not displayed.    Estimated Creatinine Clearance: 33.3 mL/min (A) (by C-G formula based on SCr of 1.04 mg/dL (H)).   Medical History: Past Medical History:  Diagnosis Date  . Diabetes mellitus without complication ([place])   . Hypertension   . Lung cancer ([place])    Assessment: 38 YOF presenting with CP, initial troponin elevated, no anticoagulation PTA. LHC revealed complex multivessel CAD, planning for staged PCI. Heparin is therapeutic, CBC stable.  Goal of Therapy:  Heparin level 0.3-0.7 units/ml Monitor platelets by anticoagulation protocol: Yes   Plan:  -Continue heparin 700 units/hr -Daily heparin level and CBC  ADDENDUM: Pt now s/p cath with inability to intervene. Pharmacy to restart heparin 8 hr after sheath pull while awaiting further TCTS recommendations.  Plan: -Resume heparin 700 units/hr no bolus at 2200 -Check  heparin level at 0600 tomorrow morning   Arrie Senate, PharmD, BCPS Clinical Pharmacist [phone] Please check AMION for all [place] numbers 04/23/2018

## 2018-04-13 NOTE — Progress Notes (Signed)
ANTICOAGULATION CONSULT NOTE - [place] for heparin Indication: chest pain/ACS  Allergies  Allergen Reactions  . Lisinopril Cough    Patient Measurements: Height: 5\' 3"  (160 cm) Weight: 156 lb 14.4 oz (71.2 kg) IBW/kg (Calculated) : 52.4 Heparin Dosing Weight: 68kg  Vital Signs: Temp: 98.4 F (36.9 C) (03/20 0832) Temp Source: Oral (03/20 0832) BP: 161/57 (03/20 0917) Pulse Rate: 64 (03/20 0917)  Labs: Recent Labs    04/06/2018 1418  04/09/2018 1452  04/06/2018 2312 04/02/2018 0334 04/20/2018 1105 04/04/2018 1647 03/27/2018 2011 04/20/2018 0353 03/26/2018 0831  HGB  --    < > 11.4*  --   --  11.5*  --   --   --  11.2*  --   HCT  --   --  36.1  --   --  34.8*  --   --   --  33.8*  --   PLT  --   --  178  --   --  196  --   --   --  185  --   HEPARINUNFRC  --   --   --   --  0.61 0.59  --   --   --   --  0.34  CREATININE 1.19*  --   --   --   --   --   --   --   --  1.13*  --   TROPONINI  --   --  0.17*   < >  --  0.62* 0.38* 0.25* 0.18*  --   --    < > = values in this interval not displayed.    Estimated Creatinine Clearance: 30.7 mL/min (A) (by C-G formula based on SCr of 1.13 mg/dL (H)).   Medical History: Past Medical History:  Diagnosis Date  . Diabetes mellitus without complication ([place])   . Hypertension   . Lung cancer ([place])    Assessment: 71 YOF presenting with CP, initial troponin elevated, no anticoagulation PTA. LHC revealed complex multivessel CAD, planning for staged PCI. Heparin is therapeutic, CBC stable.  Goal of Therapy:  Heparin level 0.3-0.7 units/ml Monitor platelets by anticoagulation protocol: Yes   Plan:  -Continue heparin 700 units/hr -Daily heparin level and CBC  Arrie Senate, PharmD, BCPS Clinical Pharmacist [phone] Please check AMION for all [facility] numbers 04/19/2018

## 2018-04-13 NOTE — Interval H&P Note (Signed)
Cath Lab Visit (complete for each Cath Lab visit)  Clinical Evaluation Leading to the Procedure:   ACS: Yes.    Non-ACS:    Anginal Classification: CCS IV  Anti-ischemic medical therapy: Maximal Therapy (2 or more classes of medications)  Non-Invasive Test Results: No non-invasive testing performed  Prior CABG: No previous CABG      History and Physical Interval Note:  03/28/2018 12:42 PM  Tammie Rios  has presented today for surgery, with the diagnosis of Unstable Angina.  The various methods of treatment have been discussed with the patient and family. After consideration of risks, benefits and other options for treatment, the patient has consented to  Procedure(s): CORONARY ATHERECTOMY (N/A) as a surgical intervention.  The patient's history has been reviewed, patient examined, no change in status, stable for surgery.  I have reviewed the patient's chart and labs.  Questions were answered to the patient's satisfaction.     Belva Crome III

## 2018-04-13 NOTE — Progress Notes (Signed)
Site area: right groin  Site Prior to Removal:  Level 1, bruising noted  Pressure Applied For 25 MINUTES    Minutes Beginning at 1655  Manual:   Yes.    Patient Status During Pull:  Stable, emotional support given by staff and daughter, Mauri Pole Pull Groin Site:  Level 0  Post Pull Instructions Given:  Yes.    Post Pull Pulses Present:  Yes.    Dressing Applied:  Yes.  Gauze dressing secured with tegaderm   Comments:  Bruising noted around and above site with a small hematoma just above the site. No hematoma, bleeding noted after pressure held  Bedrest started at [place]

## 2018-04-13 NOTE — H&P (View-Only) (Signed)
INTERVENTIONAL CARDIOLOGY   She has had recurring bouts of chest pressure despite IV nitroglycerin throughout the night.  Currently having mild chest discomfort.  2 daughters (1 by phone), patient, and I had an extensive conversation concerning treatment options.  Medical therapy has failed.  She has been accepted as a surgical candidate by Dr. Cyndia Bent and will be at increased for postop recovery and have difficulty with convalescence at her age.  The patient and family also speaks of a family member who is 83 years of age and still having difficulty recovering from open heart surgery at age 13.  We discussed PCI, the possibility of acute closure, the possibility that stent placement may not be possible, the possibility of acute injury leading to hemodynamic instability which could lead to death in the lab, and generalized heightened risk of a poor outcome to age and complicated anatomy..  The heightened risk was estimated to be 5-10 times higher, therefore acute ischemic risk estimated at 10-15 %.  Probably not an emergency surgical candidate because all rooms are full currently.  After consideration the patient has decided against elective surgery in favor of PCI.  The family concurs.  We will start IV cangrelor in case we find ourselves desiring urgent surgery.  IV morphine has been ordered for pain control.  Cath Lab soon with plans for orbital atherectomy and stenting if technically possible.   Plan right femoral approach.  We will perform EKG now since patient is having active chest pain.  Time spent, 45 minutes.

## 2018-04-14 DIAGNOSIS — Z9862 Peripheral vascular angioplasty status: Secondary | ICD-10-CM

## 2018-04-14 DIAGNOSIS — Z7189 Other specified counseling: Secondary | ICD-10-CM

## 2018-04-14 DIAGNOSIS — E1159 Type 2 diabetes mellitus with other circulatory complications: Secondary | ICD-10-CM

## 2018-04-14 DIAGNOSIS — Z515 Encounter for palliative care: Secondary | ICD-10-CM

## 2018-04-14 DIAGNOSIS — Z66 Do not resuscitate: Secondary | ICD-10-CM

## 2018-04-14 DIAGNOSIS — R079 Chest pain, unspecified: Secondary | ICD-10-CM

## 2018-04-14 DIAGNOSIS — I214 Non-ST elevation (NSTEMI) myocardial infarction: Principal | ICD-10-CM

## 2018-04-14 LAB — CBC
HCT: 29.9 % — ABNORMAL LOW (ref 36.0–46.0)
HEMOGLOBIN: 9.4 g/dL — AB (ref 12.0–15.0)
MCH: 26.6 pg (ref 26.0–34.0)
MCHC: 31.4 g/dL (ref 30.0–36.0)
MCV: 84.5 fL (ref 80.0–100.0)
Platelets: 184 10*3/uL (ref 150–400)
RBC: 3.54 MIL/uL — AB (ref 3.87–5.11)
RDW: 14.6 % (ref 11.5–15.5)
WBC: 4.9 10*3/uL (ref 4.0–10.5)
nRBC: 0 % (ref 0.0–0.2)

## 2018-04-14 LAB — BASIC METABOLIC PANEL
Anion gap: 10 (ref 5–15)
BUN: 19 mg/dL (ref 8–23)
CO2: 17 mmol/L — ABNORMAL LOW (ref 22–32)
Calcium: 8.5 mg/dL — ABNORMAL LOW (ref 8.9–10.3)
Chloride: 109 mmol/L (ref 98–111)
Creatinine, Ser: 1.22 mg/dL — ABNORMAL HIGH (ref 0.44–1.00)
GFR calc Af Amer: 45 mL/min — ABNORMAL LOW (ref >60)
GFR calc non Af Amer: 39 mL/min — ABNORMAL LOW (ref >60)
Glucose, Bld: 131 mg/dL — ABNORMAL HIGH (ref 70–99)
Potassium: 3.6 mmol/L (ref 3.5–5.1)
Sodium: 136 mmol/L (ref 135–145)

## 2018-04-14 LAB — GLUCOSE, CAPILLARY
Glucose-Capillary: 120 mg/dL — ABNORMAL HIGH (ref 70–99)
Glucose-Capillary: 132 mg/dL — ABNORMAL HIGH (ref 70–99)
Glucose-Capillary: 134 mg/dL — ABNORMAL HIGH (ref 70–99)

## 2018-04-14 MED ORDER — ENOXAPARIN SODIUM 30 MG/0.3ML ~~LOC~~ SOLN
30.0000 mg | SUBCUTANEOUS | Status: DC
  Administered 2018-04-14 – 2018-04-15 (×2): 30 mg via SUBCUTANEOUS
  Filled 2018-04-14 (×2): qty 0.3

## 2018-04-14 MED ORDER — RAMELTEON 8 MG PO TABS
8.0000 mg | ORAL_TABLET | Freq: Every day | ORAL | Status: AC
  Administered 2018-04-14: 8 mg via ORAL
  Filled 2018-04-14: qty 1

## 2018-04-14 MED ORDER — ISOSORBIDE MONONITRATE ER 30 MG PO TB24
30.0000 mg | ORAL_TABLET | Freq: Every day | ORAL | Status: DC
  Administered 2018-04-14 – 2018-04-16 (×3): 30 mg via ORAL
  Filled 2018-04-14 (×3): qty 1

## 2018-04-14 NOTE — Evaluation (Addendum)
Physical Therapy Evaluation Patient Details Name: Tammie Rios MRN: [id number] DOB: [birth year] Today's Date: 04/14/2018   History of Present Illness  Pt is a 85 y.o. female admitted 04/18/2018 with NSTEMI. LHC 3/19 shows severe 3-vessel CAD. Pt underwent unsuccessful PCI 3/20; plan is for medical management. PMH includes HTN, DM, lung CA.    Clinical Impression  Pt presents with an overall decrease in functional mobility secondary to above. PTA, pt indep and lives with daughter. Today, pt required up to minA with mobility; pt with c/o dizziness upon sitting and standing, (+) orthostatic hypotension (see values below). Increased time spent discussing SNF vs. [facility] services with pt and daughter; ultimately, daughter plans to have pt return home and hopeful for HH/aide services. Discussed DME recommendations; gait belt provided, will plan for caregiver training next session. Will follow acutely to address established goals.   Orthostatic BPs  Supine 124/88  Sitting 106/61  Standing (unable to tolerate >30 sec due to dizziness) 69/59  Sitting ~2 min 94/55  Return to supine 119/80     Follow Up Recommendations Home health PT;Supervision/Assistance - 24 hour(declined SNF; plan for outpatient palliative)    Equipment Recommendations  Rolling walker with 5" wheels;3in1 (PT)    Recommendations for Other Services       Precautions / Restrictions Precautions Precautions: Fall;Other (comment) Precaution Comments: Orthostatic hypotension      Mobility  Bed Mobility Overal bed mobility: Modified Independent             General bed mobility comments: Increased time and effort; c/o dizziness upon sitting. (+) orthostatic hypotension  Transfers Overall transfer level: Needs assistance Equipment used: Rolling walker (2 wheeled) Transfers: Sit to/from Stand Sit to Stand: Min assist;Min guard         General transfer comment: Performed 3x sit<>stand total, initially requiring minA to  stand for donning Depends with single UE support, progressed to min guard standing 2x more with RW. Cues for hand placement. Pt dizzy and (+) orthostatic  Ambulation/Gait Ambulation/Gait assistance: Min guard Gait Distance (Feet): 2 Feet Assistive device: Rolling walker (2 wheeled) Gait Pattern/deviations: Step-to pattern     General Gait Details: Took side steps towards HOB with RW and min guard. Further amb deferred secondary to pt symptomatic with soft BP  Stairs            Wheelchair Mobility    Modified Rankin (Stroke Patients Only)       Balance Overall balance assessment: Needs assistance   Sitting balance-Leahy Scale: Good       Standing balance-Leahy Scale: Poor Standing balance comment: Reliant on UE support                             Pertinent Vitals/Pain Pain Assessment: No/denies pain    Home Living Family/patient expects to be discharged to:: Private residence Living Arrangements: Children Available Help at Discharge: Family;Available 24 hours/day Type of Home: House Home Access: Stairs to enter     Home Layout: One level Home Equipment: Shower seat Additional Comments: Lives with daughter who will be home 24/7 due to COVID-19 quarantine. Pt has never required DME    Prior Function Level of Independence: Independent               Hand Dominance        Extremity/Trunk Assessment   Upper Extremity Assessment Upper Extremity Assessment: Overall WFL for tasks assessed    Lower Extremity Assessment Lower Extremity  Assessment: Generalized weakness    Cervical / Trunk Assessment Cervical / Trunk Assessment: Kyphotic  Communication   Communication: No difficulties  Cognition Arousal/Alertness: Awake/alert Behavior During Therapy: WFL for tasks assessed/performed Overall Cognitive Status: Within Functional Limits for tasks assessed                                 General Comments: WFL for simple tasks.  Some potential slowed processing/poor problem solving      General Comments General comments (skin integrity, edema, etc.): Daughter present and supportive. Prolonged discussion of SNF vs. HHPT services - daughter ultimately wanting to bring her mother home with HH/aide services and equipment    Exercises     Assessment/Plan    PT Assessment Patient needs continued PT services  PT Problem List Decreased strength;Decreased activity tolerance;Decreased balance;Decreased mobility;Cardiopulmonary status limiting activity;Decreased knowledge of use of DME       PT Treatment Interventions DME instruction;Gait training;Stair training;Functional mobility training;Therapeutic activities;Therapeutic exercise;Balance training;Patient/family education    PT Goals (Current goals can be found in the Care Plan section)  Acute Rehab PT Goals Patient Stated Goal: Return home with support from daughter and [facility] services PT Goal Formulation: With patient/family Time For Goal Achievement: 04/28/18 Potential to Achieve Goals: Good    Frequency Min 3X/week   Barriers to discharge        Co-evaluation               AM-PAC PT "6 Clicks" Mobility  Outcome Measure Help needed turning from your back to your side while in a flat bed without using bedrails?: None Help needed moving from lying on your back to sitting on the side of a flat bed without using bedrails?: None Help needed moving to and from a bed to a chair (including a wheelchair)?: A Little Help needed standing up from a chair using your arms (e.g., wheelchair or bedside chair)?: A Little Help needed to walk in hospital room?: A Little Help needed climbing 3-5 steps with a railing? : A Little 6 Click Score: 20    End of Session Equipment Utilized During Treatment: Gait belt Activity Tolerance: Treatment limited secondary to medical complications (Comment);Patient tolerated treatment well(orthostatic hypotension) Patient left: in  bed;with call bell/phone within reach;with bed alarm set;with family/visitor present Nurse Communication: Mobility status PT Visit Diagnosis: Other abnormalities of gait and mobility (R26.89);Unsteadiness on feet (R26.81)    Time: 2979-8921 PT Time Calculation (min) (ACUTE ONLY): 28 min   Charges:   PT Evaluation $PT Eval Moderate Complexity: 1 Mod PT Treatments $Self Care/Home Management: 8-22      Mabeline Caras, PT, DPT Acute Rehabilitation Services  Pager [phone] Office [phone]  Derry Lory 04/14/2018, 3:03 PM

## 2018-04-14 NOTE — Progress Notes (Signed)
   Subjective: Doing well this morning. Asking about discharge home. LHC yesterday with failed PCI attempt. Patient and daughter were updated yesterday per cards. They continue to decline surgery, wish to pursue medical management and palliative care.   Objective:  Vital signs in last 24 hours: Vitals:   04/14/18 0357 04/14/18 0430 04/14/18 0431 04/14/18 0716  BP: 113/62 (!) 139/57  135/72  Pulse: 66 70  71  Resp: (!) 25 18  14   Temp:   98.2 F (36.8 C) 98.6 F (37 C)  TempSrc:   Oral Oral  SpO2: 96% 94%  95%  Weight:   70.8 kg   Height:       Physical Exam Constitutional: NAD, appears comfortable Cardiovascular: RRR, no murmurs, rubs, or gallops.  Pulmonary/Chest: CTAB, no wheezes, rales, or rhonchi.  Abdominal: Soft, non tender, non distended. +BS.  Extremities: Warm and well perfused.  No edema.  Psychiatric: Normal mood and affect  Assessment/Plan:  Principal Problem:   NSTEMI (non-ST elevated myocardial infarction) [facility]) Active Problems:   Chest pain   Essential hypertension   Diabetes mellitus (HCC)   Elevated troponin  This is a65 year old female with history of hypertension, diabetes, and lung cancer who presented with typical chest painthat was sharp in nature,substernal, relievedwith nitroglycerin. She has been feeling unwell for a few days, including nausea,headaches, decreased appetite, dry cough and decreased energy.  NSTEMI: Troponins peaked at 0.62, EKG with lateral TWIs. LHC showed severe three vessel disease. CT surgery was consulted. She was accepted as a surgical candidate but considered high risk. Family and patient were both in agreement that they did not wish to pursue open heart surgery. Overnight on 3/20 she developed recurrent chest pain while on nitro and heparin drips. Cards added IV cangrelor and morphine and took her back to the cath lab yesterday to attempt PCI. Unfortunately procedure was aborted due to high complexity and risk. This  morning, patient denies chest pain. She is eager for discharge home. Long discussion with both the daughter and patient at bedside. They continue to decline surgery, which I feel is very appropriate given her age. They are interested in home palliative care. Their ultimate goal is to take patient home. Appreciate cardiology assistance. Imdur started today, will try to wean nitro.  -- Imdur 30 mg daily -- Wean Nitro gtt as able  -- Heparin stopped -- ASA 81 daily  -- Continue Lipitor 80 mg daily  -- Morphine PRN for pain -- Palliative care consult -- PT consult  Hypertension: Normotensive on home valsartan and nitro drip -- Continue home Valsartan-HCTZ  -- Started Imdur today; wean nitro gtt   Diabetes: -Holding home metformin, currently on SSI with frequent CBGs -Continue SSI-moderate -Continue frequent CBGs  Asthma: -Continue dulera -Albuterol PRN  FEN:No fluids, replete lytes prn, regular diet VTE WUX:[id number]  Code Status: FULL    Dispo: Anticipated discharge in approximately 0-1 day(s).   Velna Ochs, MD 04/14/2018, 7:18 AM Pager: [phone]

## 2018-04-14 NOTE — Progress Notes (Signed)
CARDIAC REHAB PHASE I   Pt orthostatic with PT. BP prior to rising was 120/52 . Pt able to stand at bedside with two assist and walker less than one minute. Became SOB, tired, and dizzy. Pt helped back to bed. BP after rising was 110/47. Noted orthostasis in Pt. MI book at bedside. Deferring exercise guidelines and diet. Will refer to CRPII [place]. Pt not appropriate.   [place], ACSM CEP 04/14/2018  3:14 PM

## 2018-04-14 NOTE — Progress Notes (Signed)
  Date: 04/14/2018  Patient name: Tammie Rios  Medical record number: [id number]  Date of birth: 1924-08-09   I have seen and evaluated this patient and I have discussed the plan of care with the house staff. Please see their note for complete details. I concur with their findings with the following additions/corrections:   Unfortunately, attempts at PCI yesterday was unsuccessful.  Discussed with [name] and her daughter at the bedside today.  Now that PCI is no longer an option, they would prefer to proceed with medical management.  They are concerned that the risk of surgery and her difficulty with recovery make surgery an untenable option.  Appreciate cardiology recommendations for medical management.  We will start Imdur and low-dose beta-blocker and wean her off of the IV nitroglycerin.  We can also stop the IV heparin.  We will need to watch her blood pressures and telemetry closely as we initiate these therapies, as she has had some hypotension with nitroglycerin and she has first-degree AV block, putting her at risk for more severe heart block with beta-blockers.  Her daughter also asked that we consult palliative care.  I agree that that is reasonable in this case as we are taking a palliative approach to significant coronary artery disease that resulted in a MI, but was unable to be treated with definitive therapy.  She will be here at least through tomorrow as we titrate her medicines and make sure she tolerates them.  I expect she will be able to return home with her daughter, but we have asked PT to evaluate her as well because she has not been out of bed much during this hospital stay.  Lenice Pressman, M.D., Ph.D. 04/14/2018, 1:21 PM

## 2018-04-14 NOTE — Progress Notes (Signed)
Progress Note  Patient Name: Tammie Rios Date of Encounter: 04/14/2018  Primary Cardiologist: Sanda Klein, MD   Subjective   No active chest pain overnight.  Does have a headache that she attributes to nitroglycerin.  Had cath done yesterday for intervention on her significant LAD lesion.  Due to patient comfort and agitation as well as complexity of treatment targets and inadequate guide support, the procedure was aborted.  She continues to have a 99% LAD.  Her family did have discussions with the hospitalist who determined that she would go home with palliative care once we can get her off of IV medications.  Inpatient Medications    Scheduled Meds: . aspirin EC  81 mg Oral Daily  . atorvastatin  80 mg Oral q1800  . cholecalciferol  2,000 Units Oral Daily  . insulin aspart  0-15 Units Subcutaneous TID WC  . latanoprost  1 drop Both Eyes QHS  . mouth rinse  15 mL Mouth Rinse BID  . mometasone-formoterol  2 puff Inhalation BID  . omega-3 acid ethyl esters  1 g Oral Daily  . pantoprazole  40 mg Oral Daily  . sodium chloride flush  3 mL Intravenous Q12H  . sodium chloride flush  3 mL Intravenous Q12H  . valsartan  160 mg Oral Daily  . vitamin C  500 mg Oral Daily   Continuous Infusions: . sodium chloride    . heparin 700 Units/hr (04/14/18 0128)  . nitroGLYCERIN 15 mcg/min (04/01/2018 2013)   PRN Meds: sodium chloride, acetaminophen, albuterol, morphine injection, nitroGLYCERIN, ondansetron (ZOFRAN) IV, oxyCODONE, sodium chloride flush, sodium chloride flush   Vital Signs    Vitals:   04/14/18 0357 04/14/18 0430 04/14/18 0431 04/14/18 0716  BP: 113/62 (!) 139/57  135/72  Pulse: 66 70  71  Resp: (!) 25 18  14   Temp:   98.2 F (36.8 C) 98.6 F (37 C)  TempSrc:   Oral Oral  SpO2: 96% 94%  95%  Weight:   70.8 kg   Height:        Intake/Output Summary (Last 24 hours) at 04/14/2018 0819 Last data filed at 04/14/2018 0500 Gross per 24 hour  Intake 129.22 ml  Output  150 ml  Net -20.78 ml   Last 3 Weights 04/14/2018 04/18/2018 04/22/2018  Weight (lbs) 156 lb 1.4 oz 156 lb 14.4 oz 154 lb 8 oz  Weight (kg) 70.8 kg 71.169 kg 70.081 kg      Telemetry    Sinus rhythm- Personally Reviewed  ECG    Rhythm, anterior and lateral T wave inversions- Personally Reviewed  Physical Exam   GEN: No acute distress.   Neck: No JVD Cardiac: RRR, no murmurs, rubs, or gallops.  Respiratory: Clear to auscultation bilaterally. GI: Soft, nontender, non-distended  MS: No edema; No deformity. Neuro:  Nonfocal  Psych: Normal affect   Labs    Chemistry Recent Labs  Lab 04/23/2018 0353 03/26/2018 1050 04/14/18 0424  NA 137 134* 136  K 3.7 3.6 3.6  CL 107 108 109  CO2 16* 18* 17*  GLUCOSE 119* 140* 131*  BUN 14 16 19   CREATININE 1.13* 1.04* 1.22*  CALCIUM 8.9 8.7* 8.5*  GFRNONAA 42* 47* 39*  GFRAA 49* 54* 45*  ANIONGAP 14 8 10      Hematology Recent Labs  Lab 03/25/2018 0334 04/03/2018 0353 04/14/18 0424  WBC 4.3 3.9* 4.9  RBC 4.26 4.08 3.54*  HGB 11.5* 11.2* 9.4*  HCT 34.8* 33.8* 29.9*  MCV 81.7  82.8 84.5  MCH 27.0 27.5 26.6  MCHC 33.0 33.1 31.4  RDW 14.5 14.6 14.6  PLT 196 185 184    Cardiac Enzymes Recent Labs  Lab 04/10/2018 0334 04/18/2018 1105 04/06/2018 1647 04/20/2018 2011  TROPONINI 0.62* 0.38* 0.25* 0.18*    Recent Labs  Lab 03/25/2018 1423  TROPIPOC 0.20*     BNPNo results for input(s): BNP, PROBNP in the last 168 hours.   DDimer No results for input(s): DDIMER in the last 168 hours.   Radiology    No results found.  Cardiac Studies   LHC 04/05/2018  Heavy three-vessel coronary calcification with downstream three-vessel coronary tortuosity.  Left main is patent.  LAD contains complex proximal to mid 99% stenosis followed by ectasia/aneurysm followed by 90% stenosis at bifurcation with the first diagonal.  Downstream LAD is tortuous and calcified.  Circumflex is widely patent and along with LAD supplies collaterals to the PDA.   Heavy calcification of the right coronary with total occlusion of the distal vessel.  Left ventricle is normal in size and function based upon echo performed this admission.  LVEDP 18 mmHg.  Patient Profile     85 y.o. female history of hypertension, diabetes, lung cancer, and iron deficiency anemia presented to the hospital with a non-STEMI  Assessment & Plan    1.  Non-STEMI: Currently she has significant coronary artery disease with a life-threatening LAD lesion.  Coronary intervention was attempted, but was unsuccessful due to tortuosity and patient agitation.  At this point, the family has discussed plans of care with the hospitalist and has decided on palliative care.  We will start her on Imdur today to see if we can get her chest pain under better control as well as low-dose beta-blocker.  This will hopefully allow [place] to stop the IV nitroglycerin.  2.  Hypertension: Currently well controlled  3.  Diabetes: Plan per primary.     For questions or updates, please contact [place] Please consult [email or web address] for contact info under        Signed, Will Meredith Leeds, MD  04/14/2018, 8:19 AM

## 2018-04-14 NOTE — Consult Note (Signed)
Consultation Note Date: 04/14/2018   Patient Name: Tammie Rios  DOB: 1924-12-09  MRN: [id number]  Age / Sex: 85 y.o., female  PCP: Tammie Medin, MD (Inactive) Referring Physician: Oda Kilts, MD  Reason for Consultation: Establishing goals of care  HPI/Patient Profile: 85 y.o. female admitted on 04/19/2018 from home with complaints of chest pain and nausea. She has a past medical history of diabetes, hypertension, non-small cell lung cancer stage I in 2010 s/p radiation, and asthma. During her ED course daughter reported patient had not been feeling well for several days prior to admission. She complained of decreased appetite, cough, weakness, and nausea. She was given ASA and nitro per EMS in route due to substernal sharp pain which subsided post medications. EKG in ED showed NSR, 0.20 which later increased 0.34, 0.62. Chest x-ray showed no acute abnormalities. Since admission patient has been seen by cardiology and underwent and unsuccessful PCI on 04/18/2018. Patient and family declining surgical intervention. Current cardiology recommendations are to continue with medical management. Palliative Medicine team consulted for goals of care.    Clinical Assessment and Goals of Care: I have reviewed medical records including lab results, imaging, Epic notes, and MAR, received report from the bedside RN, and assessed the patient. I met at the bedside with patient and her daughter, Tammie Rios to discuss diagnosis prognosis, [place], EOL wishes, disposition and options. Patient asleep but easily aroused. She is A&O x3. Denied pain or shortness of breath.   I introduced Palliative Medicine as specialized medical care for people living with serious illness. It focuses on providing relief from the symptoms and stress of a serious illness. The goal is to improve quality of life for both the patient and the family.   Patient reports she has 4 children of which 2 lives in [place]. Patient resides with her daughter. She is of [place] faith. She has a small dog which she loves dearly and walks and cares for daily.   As far as functional and nutritional status prior to admission patient reports she was ambulatory without assistive devices. Daughter reports she has been walking a little slower, but gait remains steady. She is able to perform most ADLs independently. She requires someone to cook and set-up meals. Her appetite is generally pretty good per patient and daughter, with some decrease recently due to not feeling well. Daughter and patient reports overall patient has been doing well with little concerns with her overall functional or health condition. Patient reports she was mopping her kitchen a week ago.   We discussed her current illness and what it means in the larger context of her  on-going co-morbidities. We discussed specifically patients cardiac and overall function. Natural disease trajectory and expectations at EOL were discussed. Both patient and daughter verbalized understanding of illness.   Tammie Rios reports "I am 85 years old and have lived a good life, if it is my time to go I will be ready to go but I am not going to go because of having  surgery or being in the hospital!" Daughter supports statement. Patient states she is not interested in surgical interventions because she feels she may not make it or recover. She expressed quality of life is important and her wishes are to return home with medication and remain hopeful this will provide some stability and if not "she will be preparing for the inevitable". Support given.   I attempted to elicit values and goals of care important to the patient.    The difference between aggressive medical intervention and comfort care was considered in light of the patient's goals of care. Patient and daughter expressed wishes to continue to treat medically  without aggressive measures.   Tammie Rios has a documented advanced directives. I discussed in details her current full code status with consideration of her current condition. Patient express her wishes is to be a DNR/DNI. She reports "if something happens please do not revive me, I want to be comfortable and allowed to pass away naturally when God says it is time!" Support given. I explained DNR/DNI in detailed to both patient and her daughter, Tammie Rios. Patient and daughter both again verbalized wishes for DNR/DNI. Patient and family educated that bedside RN will place purple DNR bracelet on patient to alert medical team of her wishes and a completed gold form will be placed in chart for home use. Patient and daughter verbalized understanding. T, RN at bedside and verbalized understanding of patient's wishes also.   Hospice and Palliative Care services outpatient were explained and offered. Patient and family verbalized their understanding and awareness of of both hospice and palliative's goals and philosophy of care. At this time daughter and patient verbalized they would like to proceed with outpatient palliative support and later consider transferring care to hospice. Support given and patient/family aware they may transition at anytime to hospice by discussing with their outpatient team.   Questions and concerns were addressed.  Hard Choices booklet left for review as well as written documentation of differences and services offered from palliative versus hospice. The family was encouraged to call with questions or concerns.  PMT will continue to support holistically.  Primary Decision Maker:  PATIENT/Daughter: Tammie Rios OF RECOMMENDATIONS    DNR/DNI-as requested by patient/confirmed by daughter.   Out of facility DNR placed on chart   Continue to treat without escalation of care as requested by patient and daughter.   Patient requesting special permission for close family  friend/Pastor Tammie Rios to visit if she is not discharged home by Monday.   Patient/Daughter expressed goal is to return home with medical management and outpatient palliative support. Aware she may transition to hospice at any time they feel necessary/appropriate. No aggressive measures (surgical interventions)  Outpatient palliative at discharge (consult placed for case management)  PMT will continue to follow and support as needed.   Code Status/Advance Care Planning:  DNR/DNI  Symptom Management:   Per attending   Palliative Prophylaxis:   Bowel Regimen, Delirium Protocol and Frequent Pain Assessment  Additional Recommendations (Limitations, Scope, Preferences):  No Surgical Procedures and Continue to medically treat without escalation of care or surgical interventions  Psycho-social/Spiritual:   Desire for further Chaplaincy support:NO   Additional Recommendations: Education on Hospice  Prognosis:   Guarded to Poor in the setting of non-STEMI with medical management and no surgical interventions per patient and family, hypertension, diabetes, decreased mobility, and history of non-small cell lung cancer s/p radiation in 2010.   Discharge Planning: Home with Palliative  Services      Primary Diagnoses: Present on Admission: . Chest pain . NSTEMI (non-ST elevated myocardial infarction) ([place]) . Essential hypertension   I have reviewed the medical record, interviewed the patient and family, and examined the patient. The following aspects are pertinent.  Past Medical History:  Diagnosis Date  . Diabetes mellitus without complication ([place])   . Hypertension   . Lung cancer [facility])    Social History   Socioeconomic History  . Marital status: Widowed    Spouse name: Not on file  . Number of children: Not on file  . Years of education: Not on file  . Highest education level: Not on file  Occupational History  . Not on file  Social Needs  . Financial  resource strain: Not on file  . Food insecurity:    Worry: Not on file    Inability: Not on file  . Transportation needs:    Medical: Not on file    Non-medical: Not on file  Tobacco Use  . Smoking status: Never Smoker  Substance and Sexual Activity  . Alcohol use: No  . Drug use: No  . Sexual activity: Not on file  Lifestyle  . Physical activity:    Days per week: Not on file    Minutes per session: Not on file  . Stress: Not on file  Relationships  . Social connections:    Talks on phone: Not on file    Gets together: Not on file    Attends religious service: Not on file    Active member of club or organization: Not on file    Attends meetings of clubs or organizations: Not on file    Relationship status: Not on file  Other Topics Concern  . Not on file  Social History Narrative  . Not on file   No family history on file. Scheduled Meds: . aspirin EC  81 mg Oral Daily  . atorvastatin  80 mg Oral q1800  . cholecalciferol  2,000 Units Oral Daily  . enoxaparin (LOVENOX) injection  30 mg Subcutaneous Q24H  . insulin aspart  0-15 Units Subcutaneous TID WC  . isosorbide mononitrate  30 mg Oral Daily  . latanoprost  1 drop Both Eyes QHS  . mouth rinse  15 mL Mouth Rinse BID  . mometasone-formoterol  2 puff Inhalation BID  . omega-3 acid ethyl esters  1 g Oral Daily  . pantoprazole  40 mg Oral Daily  . sodium chloride flush  3 mL Intravenous Q12H  . sodium chloride flush  3 mL Intravenous Q12H  . valsartan  160 mg Oral Daily  . vitamin C  500 mg Oral Daily   Continuous Infusions: . sodium chloride     PRN Meds:.sodium chloride, acetaminophen, albuterol, morphine injection, nitroGLYCERIN, ondansetron (ZOFRAN) IV, oxyCODONE, sodium chloride flush, sodium chloride flush Medications Prior to Admission:  Prior to Admission medications   Medication Sig Start Date End Date Taking? Authorizing Provider  albuterol (PROVENTIL HFA;VENTOLIN HFA) 108 (90 BASE) MCG/ACT inhaler  Inhale 2 puffs into the lungs every 6 (six) hours as needed for wheezing or shortness of breath.   Yes [provider]  aspirin 81 MG chewable tablet Chew 324 mg by mouth once.   Yes [provider]  aspirin 81 MG chewable tablet Chew 81 mg by mouth daily.   Yes [provider]  Cholecalciferol (VITAMIN D) 2000 UNITS tablet Take 2,000 Units by mouth daily.   Yes [provider]  ezetimibe-simvastatin (VYTORIN) 10-40 MG per tablet Take 1 tablet by mouth daily.   Yes [provider]  latanoprost (XALATAN) 0.005 % ophthalmic solution Place 1 drop into both eyes at bedtime.   Yes [provider]  metFORMIN (GLUCOPHAGE) 500 MG tablet Take 500 mg by mouth 2 (two) times daily with a meal.   Yes [provider]  mometasone-formoterol (DULERA) 100-5 MCG/ACT AERO Inhale 2 puffs into the lungs 2 (two) times daily.   Yes [provider]  Omega-3 Fatty Acids (FISH OIL) 1000 MG CAPS Take 1,000 mg by mouth daily.   Yes [provider]  omeprazole (PRILOSEC) 20 MG capsule Take 20 mg by mouth daily before breakfast.   Yes [provider]  simvastatin (ZOCOR) 40 MG tablet Take 40 mg by mouth at bedtime. 03/22/18  Yes [provider]  valsartan-hydrochlorothiazide (DIOVAN-HCT) 160-12.5 MG per tablet Take 1 tablet by mouth daily.   Yes [provider]  vitamin C (ASCORBIC ACID) 500 MG tablet Take 500 mg by mouth daily.   Yes [provider]  ondansetron (ZOFRAN) 4 MG tablet Take 1 tablet (4 mg total) by mouth every 6 (six) hours. Patient not taking: Reported on 03/28/2018 11/02/13   Dahlia Bailiff, PA-C   Allergies  Allergen Reactions  . Lisinopril Cough   Review of Systems  Constitutional: Positive for fatigue.  Neurological: Positive for weakness.  All other systems reviewed and are negative.   Physical Exam Vitals signs and nursing note reviewed.  Constitutional:      General: She is awake.      Appearance: She is underweight. She is ill-appearing.     Comments: Chronically ill appearing   Cardiovascular:     Rate and Rhythm: Normal rate and regular rhythm.     Pulses: Normal pulses.     Heart sounds: Normal heart sounds.  Pulmonary:     Effort: Pulmonary effort is normal.     Breath sounds: Decreased breath sounds present.  Abdominal:     General: Bowel sounds are normal.     Palpations: Abdomen is soft.  Skin:    General: Skin is warm and dry.  Neurological:     Mental Status: She is alert and oriented to person, place, and time.     Motor: Weakness present.  Psychiatric:        Attention and Perception: Attention normal.        Mood and Affect: Mood normal.        Speech: Speech normal.        Behavior: Behavior is cooperative.        Thought Content: Thought content normal.        Cognition and Memory: Cognition normal.        Judgment: Judgment normal.    Vital Signs: BP 105/90 (BP Location: Right Arm)   Pulse 71   Temp 98.5 F (36.9 C)   Resp 13   Ht '5\' 3"'  (1.6 m)   Wt 70.8 kg   SpO2 95%   BMI 27.65 kg/m  Pain Scale: 0-10   Pain Score: 0-No pain   SpO2: SpO2: 95 % O2 Device:SpO2: 95 % O2 Flow Rate: .O2 Flow Rate (L/min): 2 L/min  IO: Intake/output summary:   Intake/Output Summary (Last 24 hours) at 04/14/2018 1327 Last data filed at 04/14/2018 0900 Gross per 24 hour  Intake 282.47 ml  Output 400 ml  Net -117.53 ml    LBM: Last BM Date: 03/31/2018 Baseline Weight: Weight: 73.5  kg Most recent weight: Weight: 70.8 kg     Palliative Assessment/Data: PPS 30%   Flowsheet Rows     Most Recent Value  Intake Tab  Referral Department  Hospitalist  Unit at Time of Referral  Cardiac/Telemetry Unit  Palliative Care Primary Diagnosis  Cardiac  Date Notified  04/14/18  Palliative Care Type  New Palliative care  Reason for referral  Clarify Goals of Care, Psychosocial or Spiritual support  Date of Admission  04/07/2018  Date first seen by Palliative  Care  04/14/18  # of days Palliative referral response time  0 Day(s)  # of days IP prior to Palliative referral  3  Clinical Assessment  Palliative Performance Scale Score  30%  Psychosocial & Spiritual Assessment  Palliative Care Outcomes  Patient/Family meeting held?  Yes  Who was at the meeting?  patient and daughter, Tammie Rios   Palliative Care Outcomes  Changed CPR status, Clarified goals of care, Provided psychosocial or spiritual support, Linked to palliative care logitudinal support  Palliative Care follow-up planned  Yes, Home  Palliative Care Follow-up Reason  Pain, Non-pain symptom, Other (comment)  Other Treatment Preference Instructions  support, will transition to hospice later after family see how she does once home       Time In:1230 Time Out: 1335 Time Total: 65 min   Greater than 50%  of this time was spent counseling and coordinating care related to the above assessment and plan.  Signed by:  Alda Lea, AGPCNP-BC Palliative Medicine Team  Phone: [phone] Fax: [phone] Pager: [phone] Amion: Bjorn Pippin    Please contact Palliative Medicine Team phone at [phone] for questions and concerns.  For individual provider: See Shea [place]

## 2018-04-14 NOTE — TOC Initial Note (Signed)
Transition of Care [facility]) - Initial/Assessment Note    Patient Details  Name: Tammie Rios MRN: [id number] Date of Birth: 1924/06/14  Transition of Care [facility]) CM/SW Contact:    Claudie Leach, RN Phone Number: 04/14/2018, 4:51 PM  Clinical Narrative:     Pt to d/c tomorrow or Monday after failed intervention for NSTEMI.  Daughter at bedside.  Pt lived with daughter PTA and functioned independently apart from cooking.  Pt stayed home alone during day.  Now, daughter working from home and feels she and other family can support patient's needs at home.  Daughter is interested in home health services along with palliative care services.  Advised daughter some agencies have both services available.  Daughter would like to discuss choice with siblings and let me know tomorrow.    Daughter confirms patient needs a RW and 3n1.  DME ordered from [employer] to be delivered today or tomorrow.            Expected Discharge Plan: [place] Barriers to Discharge: No Barriers Identified   Patient Goals and CMS Choice Patient states their goals for this hospitalization and ongoing recovery are:: to go home CMS Medicare.gov Compare Post Acute Care list provided to:: Patient Represenative (must comment) Choice offered to / list presented to : Adult Children(daughter)  Expected Discharge Plan and Services Expected Discharge Plan: [place] In-house Referral: Hospice / Palliative Care Discharge Planning Services: CM Consult Post Acute Care Choice: Home Health(palliative) Living arrangements for the past 2 months: Single Family Home                 DME Arranged: 3-N-1, [place] rolling DME Agency: [employer] HH Arranged: PT    Prior Living Arrangements/Services Living arrangements for the past 2 months: Single Family Home Lives with:: Adult Children Patient language and need for interpreter reviewed:: Yes Do you feel safe going back to the place where you  live?: Yes      Need for Family Participation in Patient Care: Yes (Comment) Care giver support system in place?: Yes (comment)   Criminal Activity/Legal Involvement Pertinent to Current Situation/Hospitalization: No - Comment as needed  Activities of Daily Living Home Assistive Devices/Equipment: None    Permission Sought/Granted Permission sought to share information with : Case Manager                Emotional Assessment Appearance:: Appears stated age Attitude/Demeanor/Rapport: (unengaged) Affect (typically observed): Calm Orientation: : Oriented to Self, Oriented to Place, Oriented to  Time, Oriented to Situation Alcohol / Substance Use: Never Used    Admission diagnosis:  Elevated troponin [R79.89] Chest pain, unspecified type [R07.9] Patient Active Problem List   Diagnosis Date Noted  . Elevated troponin   . NSTEMI (non-ST elevated myocardial infarction) ([place]) 03/29/2018  . Essential hypertension 04/21/2018  . Diabetes mellitus ([place]) 04/10/2018  . Chest pain 04/18/2018   PCP:  Rodena Medin, MD (Inactive) Pharmacy:   [id number] - [place], [place] - [address] AT [place] [address] [place] [address] Phone: [phone] Fax: [phone]        Readmission Risk Interventions No flowsheet data found.

## 2018-04-15 DIAGNOSIS — R0603 Acute respiratory distress: Secondary | ICD-10-CM

## 2018-04-15 DIAGNOSIS — I209 Angina pectoris, unspecified: Secondary | ICD-10-CM

## 2018-04-15 DIAGNOSIS — R0902 Hypoxemia: Secondary | ICD-10-CM

## 2018-04-15 DIAGNOSIS — Z7189 Other specified counseling: Secondary | ICD-10-CM

## 2018-04-15 DIAGNOSIS — Z515 Encounter for palliative care: Secondary | ICD-10-CM

## 2018-04-15 LAB — GLUCOSE, CAPILLARY
Glucose-Capillary: 105 mg/dL — ABNORMAL HIGH (ref 70–99)
Glucose-Capillary: 121 mg/dL — ABNORMAL HIGH (ref 70–99)
Glucose-Capillary: 161 mg/dL — ABNORMAL HIGH (ref 70–99)
Glucose-Capillary: 181 mg/dL — ABNORMAL HIGH (ref 70–99)

## 2018-04-15 LAB — BASIC METABOLIC PANEL
Anion gap: 9 (ref 5–15)
BUN: 20 mg/dL (ref 8–23)
CO2: 18 mmol/L — ABNORMAL LOW (ref 22–32)
Calcium: 8.7 mg/dL — ABNORMAL LOW (ref 8.9–10.3)
Chloride: 108 mmol/L (ref 98–111)
Creatinine, Ser: 1.19 mg/dL — ABNORMAL HIGH (ref 0.44–1.00)
GFR calc Af Amer: 46 mL/min — ABNORMAL LOW (ref >60)
GFR calc non Af Amer: 40 mL/min — ABNORMAL LOW (ref >60)
Glucose, Bld: 148 mg/dL — ABNORMAL HIGH (ref 70–99)
Potassium: 3.5 mmol/L (ref 3.5–5.1)
Sodium: 135 mmol/L (ref 135–145)

## 2018-04-15 MED ORDER — FUROSEMIDE 10 MG/ML IJ SOLN
40.0000 mg | Freq: Once | INTRAMUSCULAR | Status: AC
  Administered 2018-04-15: 40 mg via INTRAVENOUS
  Filled 2018-04-15: qty 4

## 2018-04-15 MED ORDER — FUROSEMIDE 10 MG/ML IJ SOLN: 40.0000 mg | Freq: Once | INTRAMUSCULAR | Status: DC

## 2018-04-15 MED ORDER — POTASSIUM CHLORIDE CRYS ER 20 MEQ PO TBCR
40.0000 meq | EXTENDED_RELEASE_TABLET | Freq: Once | ORAL | Status: AC
  Administered 2018-04-15: 40 meq via ORAL
  Filled 2018-04-15: qty 2

## 2018-04-15 MED ORDER — MORPHINE SULFATE (PF) 2 MG/ML IV SOLN
1.0000 mg | INTRAVENOUS | Status: DC | PRN
  Administered 2018-04-16: 2 mg via INTRAVENOUS
  Administered 2018-04-16 (×2): 1 mg via INTRAVENOUS
  Administered 2018-04-16: 2 mg via INTRAVENOUS
  Administered 2018-04-16 (×2): 1 mg via INTRAVENOUS
  Filled 2018-04-15 (×6): qty 1

## 2018-04-15 NOTE — Progress Notes (Signed)
° °  Subjective: [name] was resting in bed, no acute events overnight. Daughter at bedside. She reports that she does not feel well, she has been having some shortness of breath and feels very weak. Patient was not able to sleep last night. She denies any chest pain, palpations, or other concerns. The daughter reports that she spoke with the cardiologist and that the patient will be getting diuresed today. We discussed the plan for today and she is in agreement.   Objective:  Vital signs in last 24 hours: Vitals:   04/14/18 0954 04/14/18 1929 04/14/18 2043 04/15/18 0608  BP: 105/90  98/78 (!) 141/71  Pulse:   91 84  Resp: 13  19 (!) 23  Temp: 98.5 F (36.9 C)  98.6 F (37 C) 97.6 F (36.4 C)  TempSrc:   Oral Oral  SpO2:  95% 91% 100%  Weight:    71.7 kg  Height:        General: Frail, tired appearing female, NAD Cardiac: RRR, no m/r/g Pulmonary: Bilateral crackles on exam, increased work of breathing, on 10L HFNC Abdomen: Soft, non-tender, non-distended Extremity: No LE edema  Assessment/Plan:  Principal Problem:   NSTEMI (non-ST elevated myocardial infarction) (HCC) Active Problems:   Chest pain   Essential hypertension   Diabetes mellitus (HCC)   Elevated troponin  This is a44 year old female with history of hypertension, diabetes, and lung cancer who presented with typical chest painthat was sharp in nature,substernal, relievedwith nitroglycerin. She has been feeling unwell for a few days, including nausea,headaches, decreased appetite, dry cough and decreased energy.   NSTEMI: Patient presented with chest pain, found to have elevated troponins that peaked at 0.62, EKG showed inverted T waves in the lateral leads.  She was started on heparin drip and taken for cardiac catheterization yesterday.  Cardiac catheter showed severe three vessel disease. She was taken to the cath lab on 3/20 to attempt PCI however this was aborted due to high complexity and risk. After  extensive discussion the patient and family has decided to [facility] medical management. Palliative care was consulted and family has decided to pursue medical management with no invasive procedures, goal is to return home with medical management and outpatient palliative support. She is now receiving medical management. Holding BB due to hypotension and 1st degree AVB.  -Cardiology following, appreciate recommendations -Continue ASA 81 mg daily -Continue NTG gtt, wean as able -Morphine PRN -Nitro-stat PRN -PT recommends home health PT -Palliative following, appreciate assistance  Hypoxic respiratory distress: This started yesterday, she was having some shortness of breath and her O2 saturation dropped down to 84%. She is currently on 10 L HFNC and has crackles on exam. Most recent echocardiogram showed a preserved EF however she has had ischemia since then. Could be due to new onset heart failure.  -Cardiology following, appreciate recommendations -Lasix 40 mg IV once per cardiology -Strict I+Os -Daily weights -Ambulatory O2 saturation later today, may be able to be discharged with home oxygen  Hypertension: -Has had some hypertension, she was started on Imdur yesterday and continued on home valsartan-HCTZ. -Continue diovan -Continue imdur 30 mg daily -Continue to monitor  Diabetes: -Holding home metformin, currently on SSI with frequent CBGs -Continue SSI-moderate -Continue frequent CBGs  Asthma: -Continue dulera -Albuterol PRN  FEN:No fluids, replete lytes prn, NPOuntil cardiology evaluates VTE NLZ:JQBHALP drip Code Status: FULL    Dispo: Anticipated discharge in approximately 0-1 day(s).   Tammie Noble, MD 04/15/2018, 6:39 AM Pager: [phone]

## 2018-04-15 NOTE — Progress Notes (Addendum)
  Date: 04/15/2018  Patient name: Tammie Rios  Medical record number: [id number]  Date of birth: 07-Feb-1924   I have seen and evaluated this patient and I have discussed the plan of care with the house staff. Please see their note for complete details. I concur with their findings with the following additions/corrections:   Unfortunately, she developed hypoxia and respiratory distress overnight.  On my exam today, she has moderate respiratory distress and tachypnea to 40 on high flow nasal cannula.  Lungs have bilateral crackles and JVP is elevated.  She has likely developed acute heart failure due to her NSTEMI.  We will try to improve her respiratory status with diuresis and continue trying to manage her angina with a palliative approach.  We have consulted palliative care and we will be working together with the family over the next couple days to determine best next steps for her.  Lenice Pressman, M.D., Ph.D. 04/15/2018, 2:49 PM

## 2018-04-15 NOTE — Progress Notes (Signed)
PT Cancellation Note  Patient Details Name: Tammie Rios MRN: [id number] DOB: 1924/04/05   Cancelled Treatment:     Goal was to do family education today but I missed the family visit during my 2 attempts.  I observed the pt and talked to the nurse.  Pt on more O2 today and more dyspnic - breathing 34 breaths a minute.  Nursing asked me not to get her out of the bed at this time.  Would hospice be appropriate for pt at DC?  Pt will also need wheelchair and possibly hospital bed (to raise HOB due to dysnpea) at DC.   [name] 04/15/2018, 1:42 PM

## 2018-04-15 NOTE — Progress Notes (Signed)
Pt desat to 84% on 2L. RN put pt on 6L, sat at 89-90. Called RT, RT put put on HF 10L, sat now at 96%. PA/ MD notified.

## 2018-04-15 NOTE — Progress Notes (Signed)
Pt and her family refused CBG check. Will continue to monitor  Pt , no insulin given.

## 2018-04-15 NOTE — Plan of Care (Signed)
  Problem: Health Behavior/Discharge Planning: Goal: Ability to manage health-related needs will improve Outcome: Progressing   Problem: Education: Goal: Understanding of CV disease, CV risk reduction, and recovery process will improve Outcome: Progressing

## 2018-04-15 NOTE — Progress Notes (Signed)
Palliative:  I met today at Ms. Amalia Hailey' bedside with daughter, [name]. Ms. Haney has had a difficult night and morning. Her respiratory status has declined and plans for diuresis today. [name] discusses plans to eventually take her mother home and has been considering home health with [facility] along with outpatient palliative care. We discussed this option. We also discussed consideration of hospice at home as I feel that this could be more helpful and give them more support at home.   We discussed the plan to monitor for improvement or decline over the next 24 hours and to further discuss plan tomorrow for appropriate options upon discharge. We also discussed the concern that she does not diurese well or concern that renal function could decline. [name] is aware but also hopeful as her mother was fairly independent just last week prior to MI. Hard Choices given. Emotional support provided.   Exam: Restless, lethargic. Arouses and follows commands. Increasing respiratory rate. [place] increased to 10L.   Plan: - Permission given to allow [name] to continue visitation given her mother's tenuous state and that she could possibly decline to EOL.  - Morphine prn for SOB unrelieved by Lasix (discussed use with [name]).  - I will f/u tomorrow for further [place] discussions.   63 min  Vinie Sill, NP Palliative Medicine Team Pager # [phone] (M-F 8a-5p) Team Phone # [phone] (Nights/Weekends)

## 2018-04-15 NOTE — Progress Notes (Addendum)
Progress Note  Patient Name: Tammie Rios Date of Encounter: 04/15/2018  Primary Cardiologist: Sanda Klein, MD   Subjective   SOB this am-no pain  Inpatient Medications    Scheduled Meds: . aspirin EC  81 mg Oral Daily  . atorvastatin  80 mg Oral q1800  . cholecalciferol  2,000 Units Oral Daily  . enoxaparin (LOVENOX) injection  30 mg Subcutaneous Q24H  . insulin aspart  0-15 Units Subcutaneous TID WC  . isosorbide mononitrate  30 mg Oral Daily  . latanoprost  1 drop Both Eyes QHS  . mouth rinse  15 mL Mouth Rinse BID  . mometasone-formoterol  2 puff Inhalation BID  . omega-3 acid ethyl esters  1 g Oral Daily  . pantoprazole  40 mg Oral Daily  . sodium chloride flush  3 mL Intravenous Q12H  . sodium chloride flush  3 mL Intravenous Q12H  . valsartan  160 mg Oral Daily  . vitamin C  500 mg Oral Daily   Continuous Infusions: . sodium chloride     PRN Meds: sodium chloride, acetaminophen, albuterol, morphine injection, nitroGLYCERIN, ondansetron (ZOFRAN) IV, oxyCODONE, sodium chloride flush, sodium chloride flush   Vital Signs    Vitals:   04/15/18 0741 04/15/18 0800 04/15/18 0801 04/15/18 0827  BP: (!) 137/58     Pulse: 75 73 77 82  Resp: (!) 38 (!) 28 19 (!) 46  Temp: (!) 97.5 F (36.4 C)     TempSrc: Oral     SpO2: (!) 84% 91% 90% 95%  Weight:      Height:        Intake/Output Summary (Last 24 hours) at 04/15/2018 0839 Last data filed at 04/14/2018 0900 Gross per 24 hour  Intake 120 ml  Output 250 ml  Net -130 ml   Last 3 Weights 04/15/2018 04/14/2018 04/06/2018  Weight (lbs) 158 lb 156 lb 1.4 oz 156 lb 14.4 oz  Weight (kg) 71.668 kg 70.8 kg 71.169 kg      Telemetry    NSR-1st degree AVB - Personally Reviewed  ECG     NSR-68, anterior lateral TWI- Personally Reviewed  Physical Exam   GEN: Mild SOB, on O2 Neck: No JVD Cardiac: RRR, no murmurs, rubs, or gallops.  Respiratory: bilateral rales MS: No edema; No deformity. Neuro:  Nonfocal   Psych: Normal affect   Labs    Chemistry Recent Labs  Lab 04/06/2018 1050 04/14/18 0424 04/15/18 0353  NA 134* 136 135  K 3.6 3.6 3.5  CL 108 109 108  CO2 18* 17* 18*  GLUCOSE 140* 131* 148*  BUN 16 19 20   CREATININE 1.04* 1.22* 1.19*  CALCIUM 8.7* 8.5* 8.7*  GFRNONAA 47* 39* 40*  GFRAA 54* 45* 46*  ANIONGAP 8 10 9      Hematology Recent Labs  Lab 04/01/2018 0334 04/06/2018 0353 04/14/18 0424  WBC 4.3 3.9* 4.9  RBC 4.26 4.08 3.54*  HGB 11.5* 11.2* 9.4*  HCT 34.8* 33.8* 29.9*  MCV 81.7 82.8 84.5  MCH 27.0 27.5 26.6  MCHC 33.0 33.1 31.4  RDW 14.5 14.6 14.6  PLT 196 185 184    Cardiac Enzymes Recent Labs  Lab 04/08/2018 0334 04/07/2018 1105 04/23/2018 1647 04/20/2018 2011  TROPONINI 0.62* 0.38* 0.25* 0.18*    Recent Labs  Lab 04/22/2018 1423  TROPIPOC 0.20*     BNPNo results for input(s): BNP, PROBNP in the last 168 hours.   DDimer No results for input(s): DDIMER in the last 168 hours.  Radiology    No results found.  Cardiac Studies   Echo 04/17/2018 IMPRESSIONS    1. The left ventricle has normal systolic function with an ejection fraction of 60-65%. The cavity size was normal. There is mildly increased left ventricular wall thickness. Left ventricular diastolic Doppler parameters are consistent with impaired  relaxation. Indeterminate filling pressures The E/e' is 8-15. No evidence of left ventricular regional wall motion abnormalities.  2. The right ventricle has normal systolic function. The cavity was normal. There is no increase in right ventricular wall thickness.  3. The mitral valve is degenerative. Mild thickening of the mitral valve leaflet. Mild calcification of the mitral valve leaflet.  4. The aortic valve is tricuspid.  5. The aortic root and ascending aorta are normal in size and structure.  6. The inferior vena cava was normal in size with <50% respiratory variability.  Cath unsuccesssful PCI attempt   Heavy three-vessel coronary  calcification with downstream three-vessel coronary tortuosity.  Left main is patent.  LAD contains complex proximal to mid 99% stenosis followed by ectasia/aneurysm followed by 90% stenosis at bifurcation with the first diagonal.  Downstream LAD is tortuous and calcified.  Circumflex is widely patent and along with LAD supplies collaterals to the PDA.  Heavy calcification of the right coronary with total occlusion of the distal vessel.  Left ventricle is normal in size and function based upon echo performed this admission.  LVEDP 18 mmHg.  Patient Profile     85 y.o. female with a history of hypertension, diabetes, lung cancer, and iron deficiency anemia presented to the hospital with a non-STEMI. Cath done 04/22/2018- severe LAD disease and total mid distal RCA.  Pt declined CABG.  PCI attempt unsuccessful.  Family and patient have opted for palliative care.   Assessment & Plan    1.  Non-STEMI: Currently she has significant coronary artery disease with a life-threatening LAD lesion.  Coronary intervention was attempted, but was unsuccessful due to tortuosity and patient agitation.  At this point, the family has decided on palliative care.   2. Hypoxic respiratory distress I believe the patient is in CHF- (I/O incomplete) will Rx with IV Lasix  3.   Hypertension: labile- stable when recumbent, orthostatic when up  4.  Diabetes: Plan per primary.  Plan: Lasix 40mg  IV x1, DC Diovan, not on beta blocker secondary to relative bradycardia and 1st AVB.  MD to see.     For questions or updates, please contact [place] Please consult [email or web address] for contact info under        Signed, Kerin Ransom, PA-C  04/15/2018, 8:39 AM    I have seen and examined the patient along with Kerin Ransom, PA-C.  I have reviewed the chart, notes and new data.  I agree with PA's note.  Key new complaints: She is clearly very dyspneic at rest although does not describe angina Key examination  changes: Tachypnea, few scattered posterior rales.  Jugular venous distention and no edema.  Blood pressure is low Key new findings / data: Very long first-degree AV block, approximately 400 ms on telemetry  PLAN: Very challenging situation with high-grade unstable appearing proximal LAD stenosis, unsuccessful attempt at PCI in a very elderly patient.  Despite preserved left ventricular systolic function by echo, suspect that she now has congestive heart failure due to periods of ischemia involving the whole anterior wall. Palliative care consultation is ongoing as we speak. Focus on medications that can provide relief of ischemia and diuretics, palliation  of dyspnea.  Blood pressure does not allow RAAS inhibitors, conduction abnormalities limit use of beta-blockers.  Sanda Klein, MD, [place] [phone] 04/15/2018, 10:22 AM

## 2018-04-15 NOTE — Evaluation (Signed)
Occupational Therapy Evaluation Patient Details Name: Tammie Rios MRN: [id number] DOB: August 23, 1924 Today's Date: 04/15/2018    History of Present Illness Pt is a 85 y.o. female admitted 04/24/2018 with NSTEMI. LHC 3/19 shows severe 3-vessel CAD. Pt underwent unsuccessful PCI 3/20; plan is for medical management. PMH includes HTN, DM, lung CA.   Clinical Impression   Limited OT eval completed - pt limited by DOE.  She currently is able to perform self feeding and simple grooming with set up assist.  She is currently max - total a for all other aspects of ADLs.  PTA, pt was fully independent.  Daughter plans to take pt home,   Discussed DME needs for home.  Recommend w/c and w/c cushion, as well as hospital bed for home.  Will follow acutely as medically appropraite - daughter is hoping pt will improve and regain some strength and independence.  Will follow if she remains medically appropriate.     Follow Up Recommendations  Home health OT;Supervision/Assistance - 24 hour    Equipment Recommendations  Hospital bed;Wheelchair (measurements OT);Wheelchair cushion (measurements OT)    Recommendations for Other Services       Precautions / Restrictions Precautions Precautions: Fall;Other (comment) Precaution Comments: Orthostatic hypotension Restrictions Weight Bearing Restrictions: No      Mobility Bed Mobility Overal bed mobility: Needs Assistance Bed Mobility: Rolling Rolling: Modified independent (Device/Increase time)         General bed mobility comments: use of rails   Transfers                 General transfer comment: Pt unable to tolerate OOB today due to DOE and fatigue     Balance                                           ADL either performed or assessed with clinical judgement   ADL Overall ADL's : Needs assistance/impaired Eating/Feeding: Set up;Bed level   Grooming: Wash/dry hands;Wash/dry face;Set up;Bed level   Upper Body  Bathing: Maximal assistance;Bed level   Lower Body Bathing: Total assistance;Bed level   Upper Body Dressing : Total assistance;Bed level   Lower Body Dressing: Total assistance;Bed level   Toilet Transfer: Total assistance Toilet Transfer Details (indicate cue type and reason): Pt unable to tolerate activity at this time  Toileting- Clothing Manipulation and Hygiene: Total assistance;Bed level         General ADL Comments: Pt DOE 3/4 at rest on 10L 02, at times she has difficulty completing sentences      Vision         Perception     Praxis      Pertinent Vitals/Pain Pain Assessment: No/denies pain     Hand Dominance Right   Extremity/Trunk Assessment Upper Extremity Assessment Upper Extremity Assessment: Generalized weakness   Lower Extremity Assessment Lower Extremity Assessment: Generalized weakness   Cervical / Trunk Assessment Cervical / Trunk Assessment: Kyphotic   Communication Communication Communication: Expressive difficulties(pt dyspneic with communication )   Cognition Arousal/Alertness: Awake/alert Behavior During Therapy: WFL for tasks assessed/performed Overall Cognitive Status: Within Functional Limits for tasks assessed                                 General Comments: WFL for basic info    General Comments  daughter  present and reports she plans to take pt home.  Discussed DME needs for home.  Discussed use of ped pads and/or draw sheet to assist with positioning, and encouraged her to encourage pt with nutritional intake     Exercises     Shoulder Instructions      Home Living Family/patient expects to be discharged to:: Private residence Living Arrangements: Children Available Help at Discharge: Family;Available 24 hours/day Type of Home: House Home Access: Stairs to enter     Home Layout: One level     Bathroom Shower/Tub: Occupational psychologist: Handicapped height     Home Equipment: Shower seat  - built in;Bedside commode;Walker - 2 wheels(3in1 and RW have been delivered to room )   Additional Comments: Lives with daughter who will be home 24/7 due to [place]. Pt has never required DME      Prior Functioning/Environment Level of Independence: Independent                 OT Problem List: Decreased strength;Decreased activity tolerance;Impaired balance (sitting and/or standing);Cardiopulmonary status limiting activity;Decreased knowledge of use of DME or AE      OT Treatment/Interventions: Self-care/ADL training;DME and/or AE instruction;Energy conservation;Therapeutic activities;Patient/family education;Balance training    OT Goals(Current goals can be found in the care plan section) Acute Rehab OT Goals Patient Stated Goal: for pt to return home  OT Goal Formulation: With patient/family Time For Goal Achievement: 04/22/18 Potential to Achieve Goals: Fair ADL Goals Pt Will Perform Grooming: with set-up;sitting Pt Will Perform Upper Body Bathing: with min assist;sitting Pt Will Perform Lower Body Bathing: with mod assist;sit to/from stand Pt Will Perform Upper Body Dressing: with min assist;sitting Pt Will Perform Lower Body Dressing: with mod assist;sit to/from stand Pt Will Transfer to Toilet: with min assist;ambulating;bedside commode;grab bars;regular height toilet Pt Will Perform Toileting - Clothing Manipulation and hygiene: with min assist;sit to/from stand  OT Frequency: Min 2X/week   Barriers to D/C:            Co-evaluation              AM-PAC OT "6 Clicks" Daily Activity     Outcome Measure Help from another person eating meals?: None Help from another person taking care of personal grooming?: A Little Help from another person toileting, which includes using toliet, bedpan, or urinal?: Total Help from another person bathing (including washing, rinsing, drying)?: A Lot Help from another person to put on and taking off regular upper  body clothing?: Total Help from another person to put on and taking off regular lower body clothing?: Total 6 Click Score: 12   End of Session Equipment Utilized During Treatment: Oxygen Nurse Communication: Mobility status  Activity Tolerance: Patient limited by fatigue;Treatment limited secondary to medical complications (Comment)(DOE increased 02 demands ) Patient left: in bed;with call bell/phone within reach;with family/visitor present  OT Visit Diagnosis: Unsteadiness on feet (R26.81)                Time: 3614-4315 OT Time Calculation (min): 18 min Charges:  OT General Charges $OT Visit: 1 Visit OT Evaluation $OT Eval Moderate Complexity: 1 Mod  Lucille Passy, OTR/L Acute Rehabilitation Services Pager [phone] Office [phone]   Lucille Passy M 04/15/2018, 6:22 PM

## 2018-04-15 NOTE — Care Management (Addendum)
Returned to room to discuss d/c plans with daughter. Daughter states they would like to use [facility] if home health is an option.  Family has not chosen a [facility].  However, as patient's respiratory status has deteriorated, we will need to reassess patient's transition needs tomorrow or in the next few days.  Daughter spent time with palliative NP today and is reluctant to discuss in front of patient.  Patient is unable to participate in therapy today.  CM will f/u.

## 2018-04-16 ENCOUNTER — Encounter (HOSPITAL_COMMUNITY): Payer: Self-pay | Admitting: Interventional Cardiology

## 2018-04-16 DIAGNOSIS — I110 Hypertensive heart disease with heart failure: Secondary | ICD-10-CM

## 2018-04-16 DIAGNOSIS — J9601 Acute respiratory failure with hypoxia: Secondary | ICD-10-CM

## 2018-04-16 DIAGNOSIS — J811 Chronic pulmonary edema: Secondary | ICD-10-CM

## 2018-04-16 DIAGNOSIS — I5031 Acute diastolic (congestive) heart failure: Secondary | ICD-10-CM

## 2018-04-16 LAB — BASIC METABOLIC PANEL
Anion gap: 10 (ref 5–15)
BUN: 24 mg/dL — ABNORMAL HIGH (ref 8–23)
CO2: 19 mmol/L — ABNORMAL LOW (ref 22–32)
Calcium: 8.9 mg/dL (ref 8.9–10.3)
Chloride: 108 mmol/L (ref 98–111)
Creatinine, Ser: 1.24 mg/dL — ABNORMAL HIGH (ref 0.44–1.00)
GFR calc Af Amer: 44 mL/min — ABNORMAL LOW (ref >60)
GFR calc non Af Amer: 38 mL/min — ABNORMAL LOW (ref >60)
Glucose, Bld: 144 mg/dL — ABNORMAL HIGH (ref 70–99)
Potassium: 4.1 mmol/L (ref 3.5–5.1)
Sodium: 137 mmol/L (ref 135–145)

## 2018-04-16 LAB — GLUCOSE, CAPILLARY: Glucose-Capillary: 141 mg/dL — ABNORMAL HIGH (ref 70–99)

## 2018-04-16 MED ORDER — LORAZEPAM 2 MG/ML IJ SOLN: 0.5000 mg | Freq: Four times a day (QID) | INTRAMUSCULAR | Status: DC | PRN

## 2018-04-16 MED ORDER — GLYCOPYRROLATE 0.2 MG/ML IJ SOLN: 0.4000 mg | INTRAMUSCULAR | Status: DC | PRN

## 2018-04-16 MED ORDER — FUROSEMIDE 10 MG/ML IJ SOLN: 40.0000 mg | Freq: Two times a day (BID) | INTRAMUSCULAR | Status: DC

## 2018-04-16 MED ORDER — FUROSEMIDE 10 MG/ML IJ SOLN
40.0000 mg | Freq: Two times a day (BID) | INTRAMUSCULAR | Status: DC
  Administered 2018-04-16 (×2): 40 mg via INTRAVENOUS
  Filled 2018-04-16 (×2): qty 4

## 2018-04-16 MED ORDER — PANTOPRAZOLE SODIUM 40 MG IV SOLR: 40.0000 mg | INTRAVENOUS | Status: DC

## 2018-04-16 MED ORDER — MORPHINE SULFATE (PF) 2 MG/ML IV SOLN
1.0000 mg | INTRAVENOUS | Status: DC | PRN
  Administered 2018-04-16: 2 mg via INTRAVENOUS
  Filled 2018-04-16 (×2): qty 1

## 2018-04-25 NOTE — Progress Notes (Signed)
Chaplain provided prayer with daughter bedside shortly after the patient expired. Facilitated communication with charge nurse regarding two other (local) sisters visiting. Per [facility], [place] if hospital staff escorts from ED.  Chaplain has escorted one sister, who stated that other sister may or may not come.    Please call if further support is needed. Luana Shu [phone]     May 15, 2018 2000  Clinical Encounter Type  Visited With Family  Visit Type Initial;Death  Referral From Nurse  Consult/Referral To Chaplain  Spiritual Encounters  Spiritual Needs Prayer  Stress Factors  Family Stress Factors Loss

## 2018-04-25 NOTE — Progress Notes (Addendum)
  Date: 06-May-2018  Patient name: [facility]  Medical record number: [id number]  Date of birth: February 08, 1924   I have seen and evaluated this patient and I have discussed the plan of care with the house staff. Please see their note for complete details. I concur with their findings with the following additions/corrections:   Unfortunately, respiratory status remains quite tenuous due to acute HFpEF.  She is requiring high amounts of O2 and appears tachypneic and uncomfortable with significant respiratory distress this morning.  She was able to answer a few questions, but mostly was not engaged in our visit.  I discussed the plan with [name], her daughter.  We will continue with IV diuresis and see if we can make any improvement in her breathing.  If so, will continue trying to optimize her cardiac management regimen.  If not, or if she worsens, we will likely transition to more of a comfort care approach and consider inpatient hospice.  Lenice Pressman, M.D., Ph.D. May 06, 2018, 2:16 PM

## 2018-04-25 NOTE — Progress Notes (Addendum)
Palliative:  I met today at Tammie Rios bedside. She arouses at times to interact but not often. She is mostly restless at this time. Tammie Rios is at bedside. We discussed her mother's decline since yesterday with more restlessness, less reserve, now on NRB from 10L. We discussed that even with good diuresis yesterday she continues to decline. Tammie Rios is proud of how hard her mother is fighting but acknowledges that she is preparing herself and trying to prepare her siblings that she may not live much longer. Tammie Rios confirms desire for no escalation to BiPAP or ICU status. We clarified the need of morphine and comfort medications and that with time she may need more medication and that this is a natural progression. We discussed d/c CBGs and minimizing medications for comfort. Tammie Rios would like to continue cardiac medications as long as her mother can tolerate. I have offered to speak with her siblings as needed. I also offered chaplain support which is not desired at this time. Emotional support provided.   Exam: Lethargic, restless. Agitated. Labored breathing with tachypnea and accessory muscle use. NRB.   Plan: - Minimizing medications and interventions to allow comfort.  - Morphine liberalized prn to relieve SOB/pain (family would like to continue prn at this time with hope of moments of clarity and alertness). Low threshold to add morphine infusion with further decline/increased symptom burden.  - PRN medication added for anxiety and secretions.  - Anticipate hospital death hours to 1-2 days most likely.   80 min  Vinie Sill, NP Palliative Medicine Team Pager # [phone] (M-F 8a-5p) Team Phone # [phone] (Nights/Weekends)

## 2018-04-25 NOTE — Progress Notes (Signed)
1400 Noted PT note. Cardiac Rehab will sign off at this time due to palliative status. Graylon Good RN BSN 04/29/18 2:01 PM

## 2018-04-25 NOTE — Progress Notes (Signed)
   Subjective: Patient does not appear well this morning. She is tachypneic with labored breathing. Daughter at bedside. Still processing her mother's current condition but clear that she does not wish to escalate care.   Objective:  Vital signs in last 24 hours: Vitals:   04/15/18 2006 04/15/18 2044 2018/05/07 0222 2018-05-07 0641  BP:  (!) 107/48 (!) 146/62 (!) 144/65  Pulse:  67 68 84  Resp:  (!) 40  (!) 42  Temp:  (!) 97.5 F (36.4 C)  98.5 F (36.9 C)  TempSrc:  Oral  Oral  SpO2: 96% 93%  90%  Weight:    69.2 kg  Height:       Physical Exam Constitutional: NAD, appears comfortable Cardiovascular: RRR, no murmurs, rubs, or gallops.  Pulmonary/Chest: Bilateral rales, wheezing anteriorly. Increased work of breathing, tachypnea  Abdominal: Soft, non tender, non distended. +BS.  Extremities: Warm and well perfused.No edema.  Psychiatric: Normal mood and affect   Assessment/Plan:  Principal Problem:   NSTEMI (non-ST elevated myocardial infarction) ([place]) Active Problems:   Chest pain   Essential hypertension   Diabetes mellitus (HCC)   Elevated troponin   Goals of care, counseling/discussion   Palliative care encounter  NSTEMI Acute Heart Failure  Cardiogenic Pulmonary Edema  Unfortunately respiratory status has not improved despite good UOP yesterday with lasix. Suspect this is sequela of her acute NSTEMI, not amendable to PCI. Daughter at bedside has been very clear and consistent about their goals of care. She is agreeable to continuing with lasix today. If patient does not improve or continues to decline, would likely pursue more comfort measures. She is currently hypoxic on 10L high flow nasal cannula. Given DNR status and overall goals of care, doubt BiPAP would be appropriate but could discuss this with daughter if she does not improve.  Appreciate palliative care consultation.  -- Continue Lasix 40 mg BID -- Supplemental oxygen -- Telemetry -- Daily BMP -- Strict  I&Os -- Daily weights  -- Morphine prn for chest pain or SOB  HTN:  -- Continue home valsartan-HCTZ -- Continue Imdur, started this admission   Diabetes: -- Holding home metformin -- Continue SSI-moderate TID AC  Asthma: -- Continue dulera -- Albuterol PRN  FEN:No fluids, replete lytes prn, regular diet VTE GYI:[id number]  Code Status: FULL  Dispo: Anticipated discharge in approximately 2-3 day(s), home palliative care vs hospice.   Velna Ochs, MD 05-07-18, 10:15 AM Pager: [phone]

## 2018-04-25 NOTE — Progress Notes (Signed)
PT Cancellation Note  Patient Details Name: BONETTA MOSTEK MRN: [id number] DOB: 02/06/1924   Cancelled Treatment:    Reason Eval/Treat Not Completed: Medical issues which prohibited therapy RN requests hold due to medical status. Will follow and potentially sign off pending outcomes of pallative consults and depending on which way patient's medical status progresses.    Deniece Ree PT, DPT, CBIS  Supplemental Physical Therapist [facility]    Pager [phone] Acute Rehab Office [phone]

## 2018-04-25 NOTE — Discharge Summary (Signed)
  Name: Tammie Rios MRN: [id number] DOB: [birth year] 85 y.o.  Date of Admission: 03/29/2018  1:12 PM Date of Discharge: 04/19/2018 Attending Physician: Oda Kilts, MD  Discharge Diagnosis: Principal Problem:   NSTEMI (non-ST elevated myocardial infarction) [facility]) Active Problems:   Chest pain   Essential hypertension   Diabetes mellitus ([place])   Elevated troponin   Goals of care, counseling/discussion   Palliative care encounter   Acute respiratory failure with hypoxia ([place])   Cause of death: NSTEMI  Disposition and follow-up:   Tammie Rios was discharged from [facility] in expired condition.    Hospital Course: [name] is a 85 year old female with history of hypertension, diabetes, and lung cancer who presented with typical chest pain that was sharp in nature, substernal,  relieved with nitroglyceri. Found to have an NSTEMI, she was taken for a cath and failed PCI. She developed heart failure 2/2 the NSTEMI. Patient developed worsening shortness of breath and hypoxia. She was transitioned to comfort care and passed away 2/2 her HF.   Signed: Asencion Noble, MD 04/19/2018, 2:27 PM

## 2018-04-25 DEATH — deceased

## 2020-03-03 IMAGING — DX PORTABLE CHEST - 1 VIEW
1 series · 1 of 1 positions shown · non-contrast
Comparison: Chest radiographs 05/03/2017 and earlier.

CLINICAL DATA: [AGE] female with nonproductive cough for 6
days. Central chest pain.

EXAM:
PORTABLE CHEST 1 VIEW

[chest]
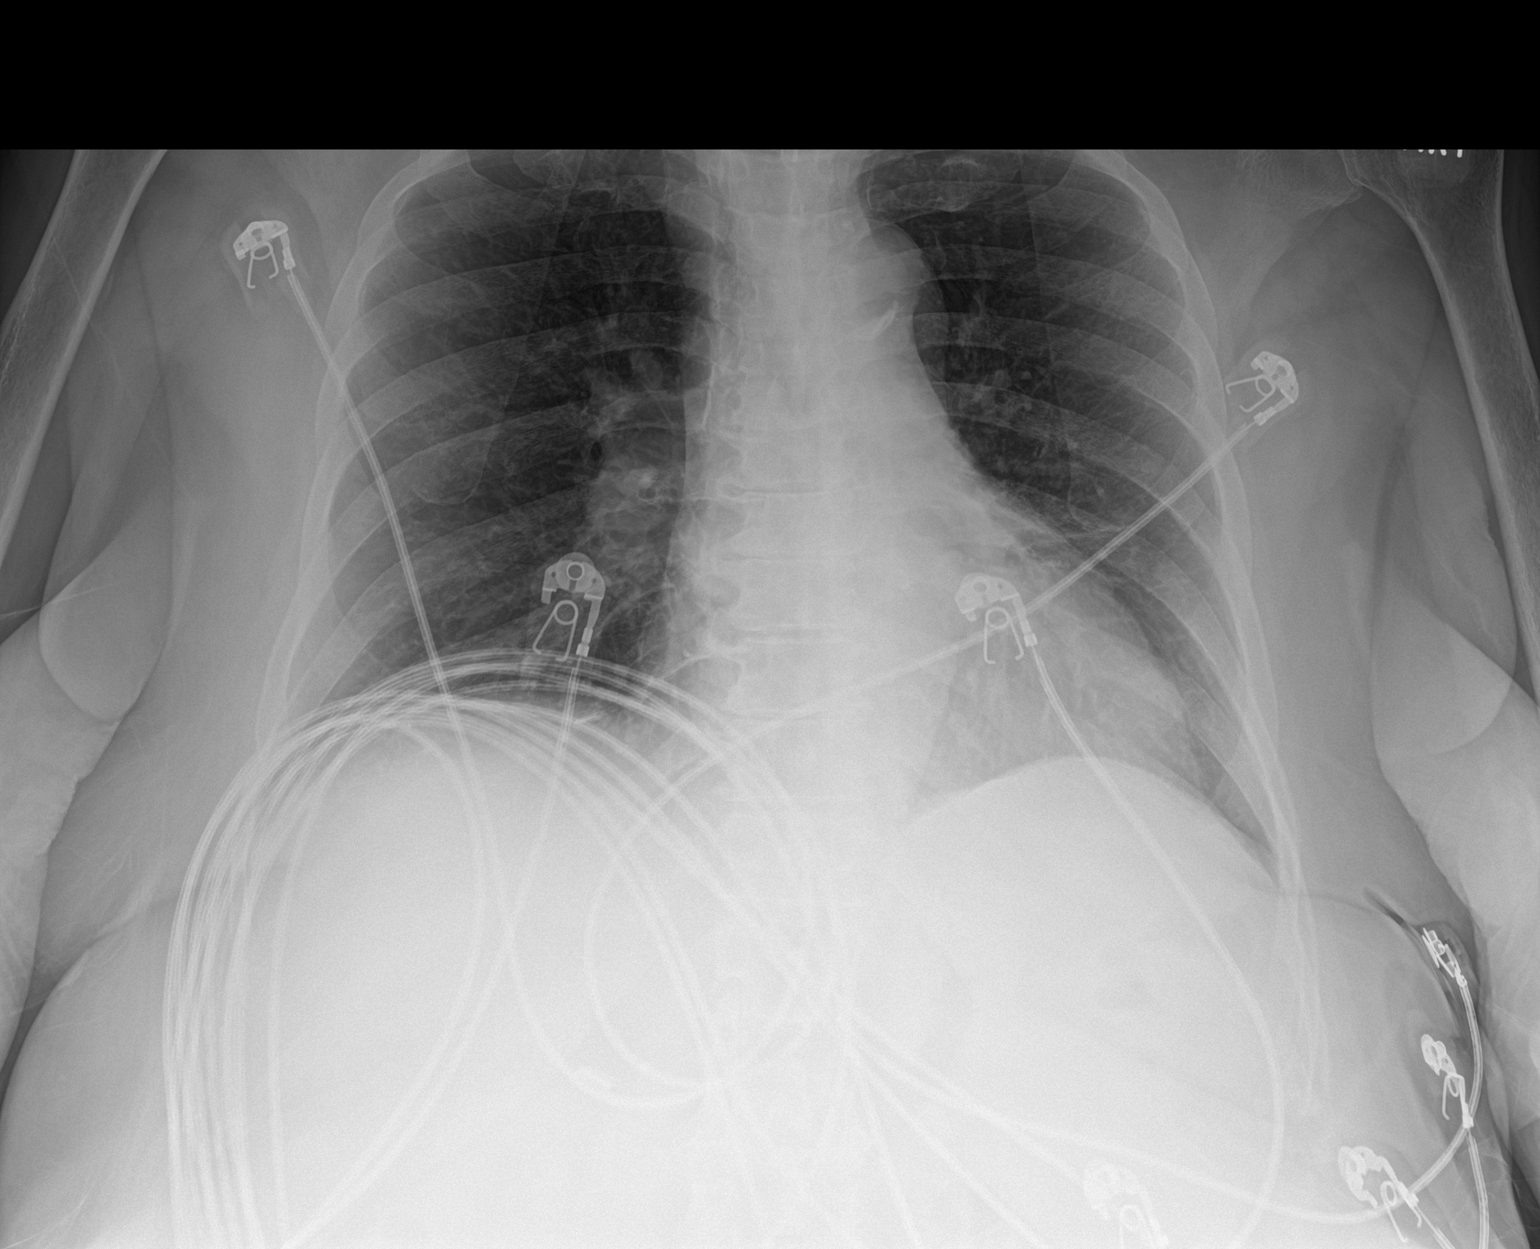

[1 of 1 positions shown; findings below may reference images not displayed]

FINDINGS: Portable AP semi upright view at 9745 hours. Lung volumes and
mediastinal contours remain normal. Visualized tracheal air column
is within normal limits. Allowing for portable technique the lungs
are clear. Paucity of bowel gas in the upper abdomen. No acute
osseous abnormality identified.

Stable cholecystectomy clips.  Calcified aortic atherosclerosis.
IMPRESSION: No acute cardiopulmonary abnormality.
